# Patient Record
Sex: Male | Born: 1935 | Race: White | Hispanic: No | Marital: Married
Health system: Southern US, Community
[De-identification: ages and names within clinical notes are randomized; demographics above are authoritative.]

---

## 1998-01-15 ENCOUNTER — Other Ambulatory Visit: Admission: RE | Admit: 1998-01-15 | Discharge: 1998-01-15 | Payer: Self-pay | Admitting: Gastroenterology

## 1999-11-03 ENCOUNTER — Inpatient Hospital Stay (HOSPITAL_COMMUNITY): Admission: EM | Admit: 1999-11-03 | Discharge: 1999-11-11 | Payer: Self-pay | Admitting: Pulmonary Disease

## 1999-11-04 ENCOUNTER — Encounter: Payer: Self-pay | Admitting: Pulmonary Disease

## 1999-11-08 ENCOUNTER — Encounter: Payer: Self-pay | Admitting: Pulmonary Disease

## 1999-11-18 ENCOUNTER — Encounter: Admission: RE | Admit: 1999-11-18 | Discharge: 2000-02-16 | Payer: Self-pay | Admitting: Pulmonary Disease

## 2000-01-17 ENCOUNTER — Encounter: Payer: Self-pay | Admitting: Emergency Medicine

## 2000-01-17 ENCOUNTER — Emergency Department (HOSPITAL_COMMUNITY): Admission: EM | Admit: 2000-01-17 | Discharge: 2000-01-17 | Payer: Self-pay | Admitting: *Deleted

## 2000-01-29 ENCOUNTER — Ambulatory Visit: Admission: RE | Admit: 2000-01-29 | Discharge: 2000-01-29 | Payer: Self-pay | Admitting: Pulmonary Disease

## 2000-01-29 ENCOUNTER — Encounter: Payer: Self-pay | Admitting: Pulmonary Disease

## 2000-02-04 ENCOUNTER — Ambulatory Visit: Admission: RE | Admit: 2000-02-04 | Discharge: 2000-02-04 | Payer: Self-pay | Admitting: Pulmonary Disease

## 2000-02-04 ENCOUNTER — Encounter (INDEPENDENT_AMBULATORY_CARE_PROVIDER_SITE_OTHER): Payer: Self-pay

## 2000-07-27 ENCOUNTER — Encounter: Admission: RE | Admit: 2000-07-27 | Discharge: 2000-10-25 | Payer: Self-pay

## 2000-09-09 ENCOUNTER — Ambulatory Visit (HOSPITAL_COMMUNITY): Admission: RE | Admit: 2000-09-09 | Discharge: 2000-09-10 | Payer: Self-pay | Admitting: *Deleted

## 2000-09-17 ENCOUNTER — Encounter: Payer: Self-pay | Admitting: *Deleted

## 2000-09-17 ENCOUNTER — Inpatient Hospital Stay (HOSPITAL_COMMUNITY): Admission: AD | Admit: 2000-09-17 | Discharge: 2000-09-27 | Payer: Self-pay | Admitting: *Deleted

## 2000-09-22 ENCOUNTER — Encounter: Payer: Self-pay | Admitting: *Deleted

## 2000-09-23 ENCOUNTER — Encounter: Payer: Self-pay | Admitting: *Deleted

## 2000-11-23 ENCOUNTER — Encounter: Payer: Self-pay | Admitting: Nephrology

## 2000-11-23 ENCOUNTER — Ambulatory Visit (HOSPITAL_COMMUNITY): Admission: RE | Admit: 2000-11-23 | Discharge: 2000-11-23 | Payer: Self-pay | Admitting: Nephrology

## 2000-12-28 ENCOUNTER — Encounter: Payer: Self-pay | Admitting: *Deleted

## 2000-12-28 ENCOUNTER — Inpatient Hospital Stay (HOSPITAL_COMMUNITY): Admission: RE | Admit: 2000-12-28 | Discharge: 2000-12-31 | Payer: Self-pay | Admitting: Internal Medicine

## 2000-12-31 ENCOUNTER — Encounter: Payer: Self-pay | Admitting: *Deleted

## 2001-01-03 ENCOUNTER — Encounter: Admission: RE | Admit: 2001-01-03 | Discharge: 2001-01-03 | Payer: Self-pay | Admitting: Nephrology

## 2001-01-03 ENCOUNTER — Encounter: Payer: Self-pay | Admitting: Nephrology

## 2001-01-03 ENCOUNTER — Ambulatory Visit (HOSPITAL_COMMUNITY): Admission: RE | Admit: 2001-01-03 | Discharge: 2001-01-03 | Payer: Self-pay | Admitting: Nephrology

## 2001-04-01 ENCOUNTER — Encounter: Payer: Self-pay | Admitting: Pulmonary Disease

## 2001-04-01 ENCOUNTER — Ambulatory Visit (HOSPITAL_COMMUNITY): Admission: RE | Admit: 2001-04-01 | Discharge: 2001-04-01 | Payer: Self-pay | Admitting: Pulmonary Disease

## 2002-08-24 ENCOUNTER — Inpatient Hospital Stay (HOSPITAL_COMMUNITY): Admission: EM | Admit: 2002-08-24 | Discharge: 2002-08-25 | Payer: Self-pay | Admitting: Pulmonary Disease

## 2002-08-24 ENCOUNTER — Encounter: Payer: Self-pay | Admitting: Pulmonary Disease

## 2002-12-27 ENCOUNTER — Inpatient Hospital Stay (HOSPITAL_COMMUNITY): Admission: EM | Admit: 2002-12-27 | Discharge: 2003-01-06 | Payer: Self-pay | Admitting: Emergency Medicine

## 2002-12-27 ENCOUNTER — Encounter: Payer: Self-pay | Admitting: Emergency Medicine

## 2002-12-28 ENCOUNTER — Encounter (INDEPENDENT_AMBULATORY_CARE_PROVIDER_SITE_OTHER): Payer: Self-pay | Admitting: Specialist

## 2002-12-28 ENCOUNTER — Encounter: Payer: Self-pay | Admitting: Pulmonary Disease

## 2002-12-31 ENCOUNTER — Encounter: Payer: Self-pay | Admitting: Pulmonary Disease

## 2003-01-01 ENCOUNTER — Encounter: Payer: Self-pay | Admitting: Cardiothoracic Surgery

## 2003-01-01 ENCOUNTER — Encounter: Payer: Self-pay | Admitting: Cardiology

## 2003-01-05 ENCOUNTER — Encounter (INDEPENDENT_AMBULATORY_CARE_PROVIDER_SITE_OTHER): Payer: Self-pay | Admitting: *Deleted

## 2003-01-05 ENCOUNTER — Encounter: Payer: Self-pay | Admitting: Pulmonary Disease

## 2004-02-21 ENCOUNTER — Ambulatory Visit (HOSPITAL_COMMUNITY): Admission: RE | Admit: 2004-02-21 | Discharge: 2004-02-21 | Payer: Self-pay | Admitting: Gastroenterology

## 2004-02-27 ENCOUNTER — Encounter: Admission: RE | Admit: 2004-02-27 | Discharge: 2004-02-27 | Payer: Self-pay | Admitting: Gastroenterology

## 2004-03-06 ENCOUNTER — Ambulatory Visit (HOSPITAL_COMMUNITY): Admission: RE | Admit: 2004-03-06 | Discharge: 2004-03-06 | Payer: Self-pay | Admitting: Gastroenterology

## 2004-04-24 ENCOUNTER — Ambulatory Visit (HOSPITAL_COMMUNITY): Admission: RE | Admit: 2004-04-24 | Discharge: 2004-04-24 | Payer: Self-pay | Admitting: Gastroenterology

## 2004-05-06 ENCOUNTER — Ambulatory Visit: Payer: Self-pay | Admitting: Pulmonary Disease

## 2004-05-08 ENCOUNTER — Ambulatory Visit: Payer: Self-pay | Admitting: Pulmonary Disease

## 2004-05-08 ENCOUNTER — Inpatient Hospital Stay (HOSPITAL_COMMUNITY): Admission: EM | Admit: 2004-05-08 | Discharge: 2004-06-04 | Payer: Self-pay | Admitting: Pulmonary Disease

## 2004-05-09 ENCOUNTER — Encounter: Payer: Self-pay | Admitting: Cardiology

## 2004-05-16 ENCOUNTER — Ambulatory Visit: Payer: Self-pay | Admitting: Cardiology

## 2004-05-23 ENCOUNTER — Ambulatory Visit: Payer: Self-pay | Admitting: Internal Medicine

## 2004-06-04 ENCOUNTER — Inpatient Hospital Stay: Admission: RE | Admit: 2004-06-04 | Discharge: 2004-06-09 | Payer: Self-pay | Admitting: Pulmonary Disease

## 2004-06-04 ENCOUNTER — Ambulatory Visit: Payer: Self-pay | Admitting: Pulmonary Disease

## 2004-06-12 ENCOUNTER — Ambulatory Visit: Payer: Self-pay | Admitting: Pulmonary Disease

## 2004-06-25 ENCOUNTER — Ambulatory Visit: Payer: Self-pay | Admitting: Pulmonary Disease

## 2004-07-09 ENCOUNTER — Ambulatory Visit: Payer: Self-pay

## 2004-07-09 ENCOUNTER — Ambulatory Visit: Payer: Self-pay | Admitting: Internal Medicine

## 2004-07-10 ENCOUNTER — Ambulatory Visit: Payer: Self-pay | Admitting: Pulmonary Disease

## 2004-07-31 ENCOUNTER — Ambulatory Visit: Payer: Self-pay | Admitting: Cardiology

## 2004-08-11 ENCOUNTER — Ambulatory Visit: Payer: Self-pay | Admitting: Cardiology

## 2004-08-12 ENCOUNTER — Ambulatory Visit: Payer: Self-pay | Admitting: Pulmonary Disease

## 2004-08-27 ENCOUNTER — Ambulatory Visit: Payer: Self-pay | Admitting: Pulmonary Disease

## 2004-09-01 ENCOUNTER — Ambulatory Visit: Payer: Self-pay | Admitting: Internal Medicine

## 2004-09-04 ENCOUNTER — Ambulatory Visit: Payer: Self-pay | Admitting: Cardiology

## 2004-09-10 ENCOUNTER — Ambulatory Visit: Payer: Self-pay | Admitting: Internal Medicine

## 2004-09-10 ENCOUNTER — Ambulatory Visit: Payer: Self-pay | Admitting: Pulmonary Disease

## 2004-09-25 ENCOUNTER — Ambulatory Visit: Payer: Self-pay | Admitting: Cardiology

## 2004-09-25 ENCOUNTER — Ambulatory Visit: Payer: Self-pay | Admitting: Internal Medicine

## 2004-09-29 ENCOUNTER — Ambulatory Visit: Payer: Self-pay | Admitting: Cardiology

## 2004-10-08 ENCOUNTER — Ambulatory Visit: Payer: Self-pay | Admitting: Pulmonary Disease

## 2004-10-27 ENCOUNTER — Ambulatory Visit: Payer: Self-pay | Admitting: Cardiology

## 2004-11-05 ENCOUNTER — Ambulatory Visit: Payer: Self-pay

## 2004-11-10 ENCOUNTER — Ambulatory Visit: Payer: Self-pay | Admitting: *Deleted

## 2004-11-19 ENCOUNTER — Ambulatory Visit: Payer: Self-pay | Admitting: Pulmonary Disease

## 2004-11-24 ENCOUNTER — Ambulatory Visit: Payer: Self-pay | Admitting: Cardiology

## 2004-11-27 ENCOUNTER — Ambulatory Visit: Payer: Self-pay

## 2004-11-27 ENCOUNTER — Ambulatory Visit: Payer: Self-pay | Admitting: Internal Medicine

## 2004-12-03 ENCOUNTER — Ambulatory Visit: Payer: Self-pay | Admitting: Cardiology

## 2004-12-08 ENCOUNTER — Ambulatory Visit: Payer: Self-pay | Admitting: Internal Medicine

## 2004-12-12 ENCOUNTER — Ambulatory Visit: Payer: Self-pay | Admitting: Internal Medicine

## 2004-12-12 ENCOUNTER — Ambulatory Visit: Payer: Self-pay | Admitting: Cardiology

## 2004-12-22 ENCOUNTER — Ambulatory Visit: Payer: Self-pay | Admitting: Cardiology

## 2005-01-05 ENCOUNTER — Ambulatory Visit: Payer: Self-pay | Admitting: Cardiology

## 2005-01-16 ENCOUNTER — Ambulatory Visit: Payer: Self-pay | Admitting: Cardiology

## 2005-01-19 ENCOUNTER — Ambulatory Visit: Payer: Self-pay | Admitting: Internal Medicine

## 2005-01-26 ENCOUNTER — Ambulatory Visit: Payer: Self-pay | Admitting: Cardiology

## 2005-02-02 ENCOUNTER — Ambulatory Visit: Payer: Self-pay | Admitting: Cardiology

## 2005-02-03 ENCOUNTER — Ambulatory Visit: Payer: Self-pay | Admitting: Internal Medicine

## 2005-02-03 ENCOUNTER — Ambulatory Visit (HOSPITAL_COMMUNITY): Admission: RE | Admit: 2005-02-03 | Discharge: 2005-02-03 | Payer: Self-pay | Admitting: Internal Medicine

## 2005-02-18 ENCOUNTER — Ambulatory Visit: Payer: Self-pay | Admitting: Cardiology

## 2005-02-24 ENCOUNTER — Ambulatory Visit: Payer: Self-pay | Admitting: Pulmonary Disease

## 2005-02-25 ENCOUNTER — Ambulatory Visit: Payer: Self-pay | Admitting: Cardiology

## 2005-03-05 ENCOUNTER — Ambulatory Visit: Payer: Self-pay | Admitting: Internal Medicine

## 2005-03-11 ENCOUNTER — Ambulatory Visit: Payer: Self-pay | Admitting: Cardiology

## 2005-03-17 ENCOUNTER — Ambulatory Visit: Payer: Self-pay | Admitting: Cardiology

## 2005-03-23 ENCOUNTER — Ambulatory Visit: Payer: Self-pay | Admitting: Cardiology

## 2005-04-07 ENCOUNTER — Ambulatory Visit: Payer: Self-pay | Admitting: Pulmonary Disease

## 2005-04-20 ENCOUNTER — Ambulatory Visit: Payer: Self-pay | Admitting: Internal Medicine

## 2005-04-22 ENCOUNTER — Ambulatory Visit: Payer: Self-pay | Admitting: Pulmonary Disease

## 2005-04-24 ENCOUNTER — Ambulatory Visit: Payer: Self-pay | Admitting: Cardiovascular Disease

## 2005-04-29 ENCOUNTER — Ambulatory Visit: Payer: Self-pay | Admitting: Cardiology

## 2005-05-06 ENCOUNTER — Ambulatory Visit: Payer: Self-pay | Admitting: *Deleted

## 2005-05-12 ENCOUNTER — Ambulatory Visit: Payer: Self-pay | Admitting: Internal Medicine

## 2005-05-22 ENCOUNTER — Ambulatory Visit: Payer: Self-pay | Admitting: Cardiology

## 2005-06-02 ENCOUNTER — Ambulatory Visit: Payer: Self-pay | Admitting: Pulmonary Disease

## 2005-06-12 ENCOUNTER — Ambulatory Visit: Payer: Self-pay | Admitting: Cardiology

## 2005-07-10 ENCOUNTER — Ambulatory Visit: Payer: Self-pay | Admitting: Cardiology

## 2005-07-20 ENCOUNTER — Ambulatory Visit: Payer: Self-pay | Admitting: Cardiology

## 2005-07-27 ENCOUNTER — Ambulatory Visit: Payer: Self-pay | Admitting: Internal Medicine

## 2005-08-03 ENCOUNTER — Ambulatory Visit: Payer: Self-pay | Admitting: Pulmonary Disease

## 2005-08-04 ENCOUNTER — Ambulatory Visit: Payer: Self-pay | Admitting: Cardiology

## 2005-08-13 ENCOUNTER — Ambulatory Visit: Payer: Self-pay | Admitting: Pulmonary Disease

## 2005-08-17 ENCOUNTER — Ambulatory Visit: Payer: Self-pay | Admitting: Internal Medicine

## 2005-08-24 ENCOUNTER — Ambulatory Visit: Payer: Self-pay | Admitting: Cardiology

## 2005-09-07 ENCOUNTER — Ambulatory Visit: Payer: Self-pay | Admitting: Cardiology

## 2005-09-10 ENCOUNTER — Encounter: Admission: RE | Admit: 2005-09-10 | Discharge: 2005-09-10 | Payer: Self-pay | Admitting: Nephrology

## 2005-09-21 ENCOUNTER — Ambulatory Visit: Payer: Self-pay | Admitting: Pulmonary Disease

## 2005-09-25 ENCOUNTER — Ambulatory Visit: Payer: Self-pay | Admitting: Cardiovascular Disease

## 2005-10-05 ENCOUNTER — Ambulatory Visit: Payer: Self-pay | Admitting: Cardiology

## 2005-10-20 ENCOUNTER — Ambulatory Visit: Payer: Self-pay | Admitting: Cardiology

## 2005-10-23 ENCOUNTER — Ambulatory Visit: Payer: Self-pay | Admitting: Cardiology

## 2005-10-23 ENCOUNTER — Ambulatory Visit (HOSPITAL_COMMUNITY): Admission: RE | Admit: 2005-10-23 | Discharge: 2005-10-23 | Payer: Self-pay | Admitting: Cardiology

## 2005-10-28 ENCOUNTER — Ambulatory Visit: Payer: Self-pay | Admitting: *Deleted

## 2005-11-04 ENCOUNTER — Encounter: Payer: Self-pay | Admitting: Pulmonary Disease

## 2005-11-05 ENCOUNTER — Ambulatory Visit: Payer: Self-pay | Admitting: Cardiology

## 2005-11-05 ENCOUNTER — Inpatient Hospital Stay (HOSPITAL_COMMUNITY): Admission: AD | Admit: 2005-11-05 | Discharge: 2005-11-23 | Payer: Self-pay | Admitting: Internal Medicine

## 2005-11-05 ENCOUNTER — Ambulatory Visit: Payer: Self-pay | Admitting: Internal Medicine

## 2005-11-25 ENCOUNTER — Ambulatory Visit: Payer: Self-pay | Admitting: Pulmonary Disease

## 2005-11-27 ENCOUNTER — Ambulatory Visit: Payer: Self-pay | Admitting: Internal Medicine

## 2005-11-27 ENCOUNTER — Ambulatory Visit: Payer: Self-pay | Admitting: Cardiology

## 2005-12-11 ENCOUNTER — Ambulatory Visit: Payer: Self-pay | Admitting: Cardiology

## 2005-12-11 ENCOUNTER — Ambulatory Visit: Payer: Self-pay | Admitting: Internal Medicine

## 2005-12-17 ENCOUNTER — Inpatient Hospital Stay (HOSPITAL_COMMUNITY): Admission: AD | Admit: 2005-12-17 | Discharge: 2005-12-25 | Payer: Self-pay | Admitting: Pulmonary Disease

## 2005-12-17 ENCOUNTER — Ambulatory Visit: Payer: Self-pay | Admitting: Cardiovascular Disease

## 2005-12-17 ENCOUNTER — Ambulatory Visit: Payer: Self-pay | Admitting: Pulmonary Disease

## 2005-12-31 ENCOUNTER — Ambulatory Visit: Payer: Self-pay | Admitting: Internal Medicine

## 2006-01-08 ENCOUNTER — Ambulatory Visit: Payer: Self-pay | Admitting: Internal Medicine

## 2006-01-11 ENCOUNTER — Ambulatory Visit: Payer: Self-pay | Admitting: Pulmonary Disease

## 2006-01-22 ENCOUNTER — Ambulatory Visit: Payer: Self-pay | Admitting: Cardiology

## 2006-02-05 ENCOUNTER — Ambulatory Visit: Payer: Self-pay | Admitting: Internal Medicine

## 2006-02-22 ENCOUNTER — Ambulatory Visit: Payer: Self-pay | Admitting: Pulmonary Disease

## 2006-03-05 ENCOUNTER — Ambulatory Visit: Payer: Self-pay | Admitting: Cardiology

## 2006-03-09 ENCOUNTER — Ambulatory Visit: Payer: Self-pay | Admitting: Internal Medicine

## 2006-03-19 ENCOUNTER — Ambulatory Visit: Payer: Self-pay | Admitting: Cardiology

## 2006-04-02 ENCOUNTER — Ambulatory Visit: Payer: Self-pay | Admitting: Cardiology

## 2006-04-06 ENCOUNTER — Ambulatory Visit: Payer: Self-pay | Admitting: Pulmonary Disease

## 2006-04-12 ENCOUNTER — Ambulatory Visit: Payer: Self-pay | Admitting: Cardiovascular Disease

## 2006-04-15 ENCOUNTER — Ambulatory Visit: Payer: Self-pay | Admitting: Internal Medicine

## 2006-04-15 ENCOUNTER — Emergency Department (HOSPITAL_COMMUNITY): Admission: EM | Admit: 2006-04-15 | Discharge: 2006-04-15 | Payer: Self-pay | Admitting: Emergency Medicine

## 2006-04-22 ENCOUNTER — Ambulatory Visit: Payer: Self-pay | Admitting: Pulmonary Disease

## 2006-05-03 ENCOUNTER — Ambulatory Visit: Payer: Self-pay | Admitting: Cardiology

## 2006-05-11 ENCOUNTER — Ambulatory Visit: Payer: Self-pay | Admitting: Pulmonary Disease

## 2006-05-26 ENCOUNTER — Ambulatory Visit: Payer: Self-pay | Admitting: Cardiology

## 2006-06-22 ENCOUNTER — Ambulatory Visit: Payer: Self-pay | Admitting: Pulmonary Disease

## 2006-07-08 ENCOUNTER — Ambulatory Visit: Payer: Self-pay | Admitting: Internal Medicine

## 2006-07-16 ENCOUNTER — Ambulatory Visit: Payer: Self-pay | Admitting: Internal Medicine

## 2006-07-20 ENCOUNTER — Ambulatory Visit: Payer: Self-pay | Admitting: Internal Medicine

## 2006-07-28 ENCOUNTER — Ambulatory Visit: Payer: Self-pay | Admitting: Internal Medicine

## 2006-08-03 ENCOUNTER — Ambulatory Visit: Payer: Self-pay | Admitting: Pulmonary Disease

## 2006-08-05 ENCOUNTER — Ambulatory Visit: Payer: Self-pay | Admitting: Internal Medicine

## 2006-08-05 LAB — CONVERTED CEMR LAB
BUN: 64 mg/dL — ABNORMAL HIGH (ref 6–23)
Calcium: 8.8 mg/dL (ref 8.4–10.5)
GFR calc Af Amer: 28 mL/min
GFR calc non Af Amer: 23 mL/min
INR: 1.8 (ref 0.9–2.0)
Potassium: 4.7 meq/L (ref 3.5–5.1)

## 2006-08-09 ENCOUNTER — Ambulatory Visit: Payer: Self-pay | Admitting: Cardiology

## 2006-08-09 LAB — CONVERTED CEMR LAB
BUN: 55 mg/dL — ABNORMAL HIGH (ref 6–23)
CO2: 27 meq/L (ref 19–32)
Calcium: 9.2 mg/dL (ref 8.4–10.5)
GFR calc Af Amer: 29 mL/min
GFR calc non Af Amer: 24 mL/min
Glucose, Bld: 282 mg/dL — ABNORMAL HIGH (ref 70–99)
Potassium: 3.9 meq/L (ref 3.5–5.1)

## 2006-08-31 ENCOUNTER — Ambulatory Visit: Payer: Self-pay | Admitting: Cardiology

## 2006-09-09 IMAGING — CR DG CHEST 1V PORT
1 series · 1 of 1 positions shown · non-contrast
Comparison: 12/17/05.

CLINICAL DATA: Evaluate PICC line placement.
 CHEST ? 1 VIEW:

[view not recorded]
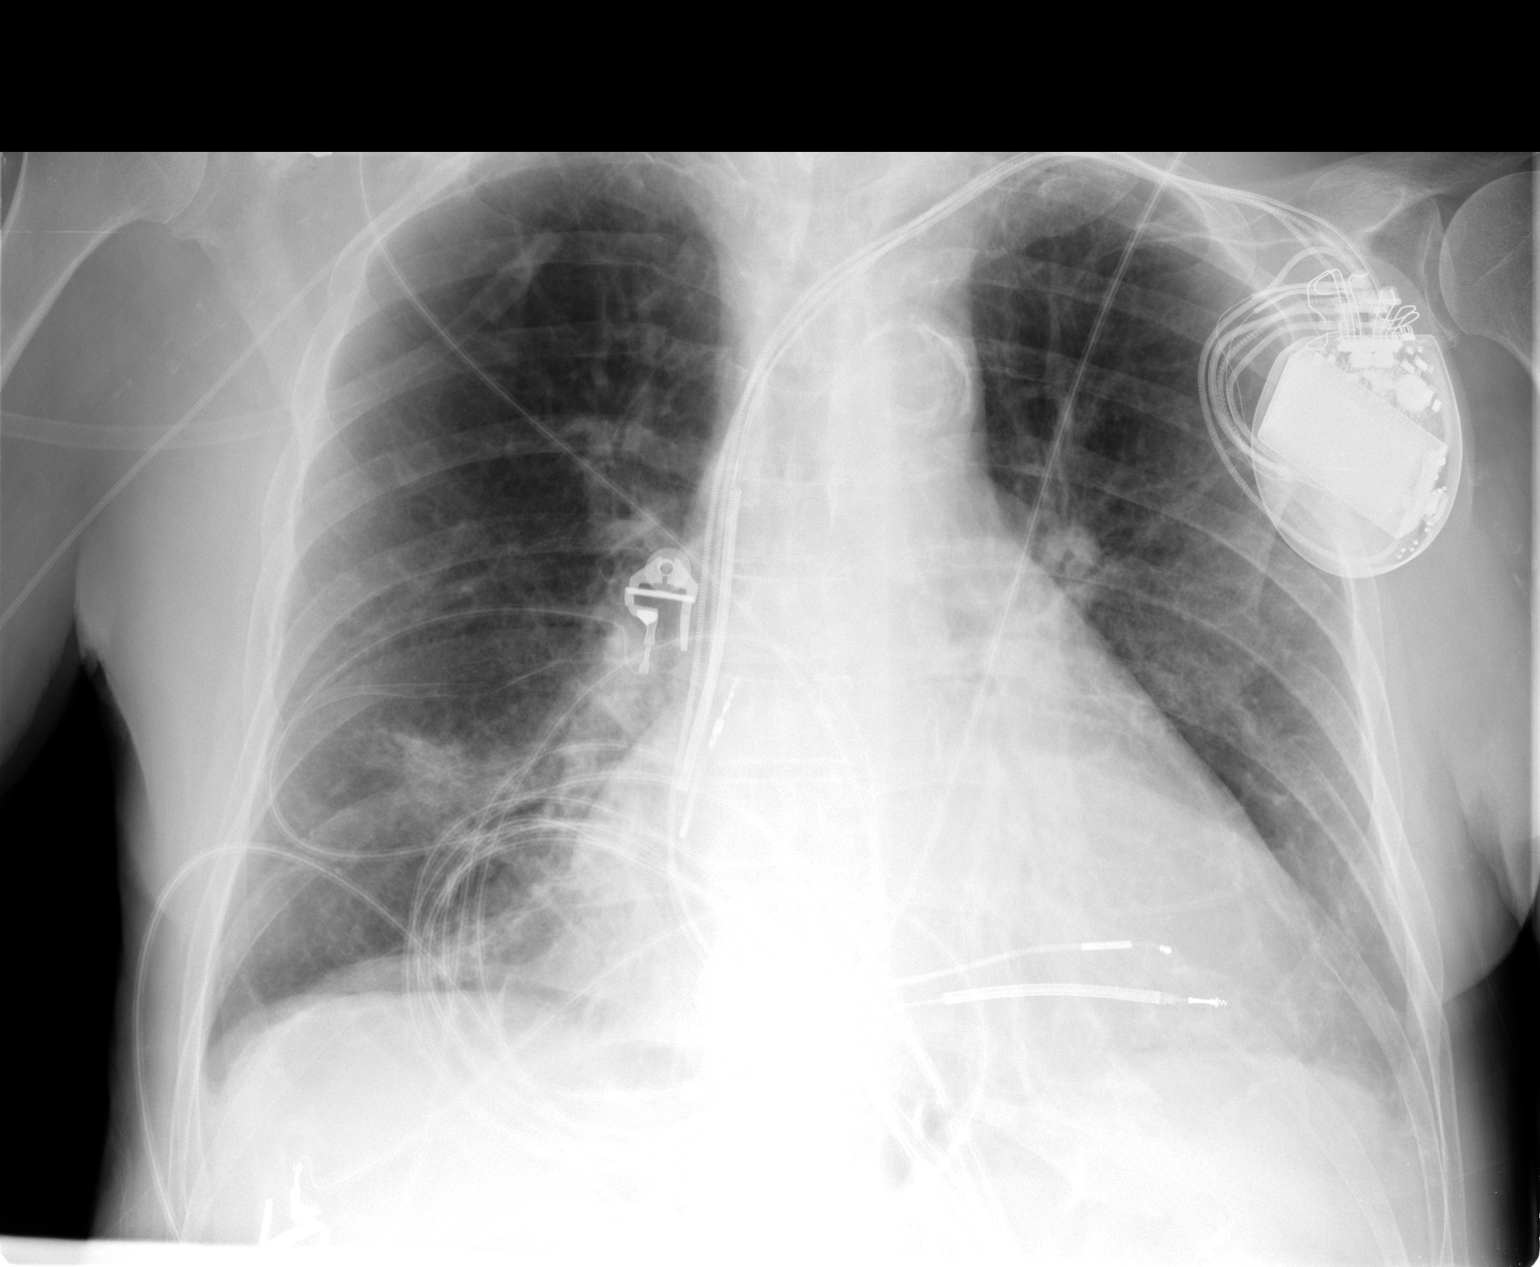

[1 of 1 positions shown; findings below may reference images not displayed]

FINDINGS: Right-sided PICC line tip is within the right IJ.  This needs repositioning into the SVC.  Heart size is enlarged.
 Again seen is right lower lobe airspace disease consistent with atelectasis or pneumonia.  Recommend follow-up examination to ensure resolution.
IMPRESSION: 1.  No change in aeration of the lungs.
 2.  Right-sided PICC line remains within the right IJ.

## 2006-09-13 ENCOUNTER — Ambulatory Visit: Payer: Self-pay | Admitting: Pulmonary Disease

## 2006-09-14 ENCOUNTER — Ambulatory Visit: Payer: Self-pay | Admitting: Cardiology

## 2006-09-16 ENCOUNTER — Ambulatory Visit: Payer: Self-pay | Admitting: Internal Medicine

## 2006-09-29 ENCOUNTER — Ambulatory Visit: Payer: Self-pay | Admitting: Pulmonary Disease

## 2006-09-29 LAB — CONVERTED CEMR LAB
ALT: 10 units/L (ref 0–40)
AST: 26 units/L (ref 0–37)
Albumin: 3.4 g/dL — ABNORMAL LOW (ref 3.5–5.2)
Alkaline Phosphatase: 95 units/L (ref 39–117)
BUN: 40 mg/dL — ABNORMAL HIGH (ref 6–23)
Basophils Absolute: 0 10*3/uL (ref 0.0–0.1)
Calcium: 9 mg/dL (ref 8.4–10.5)
Chloride: 108 meq/L (ref 96–112)
Creatinine, Ser: 2.4 mg/dL — ABNORMAL HIGH (ref 0.4–1.5)
Eosinophils Absolute: 0.1 10*3/uL (ref 0.0–0.6)
GFR calc non Af Amer: 28 mL/min
HCT: 35.3 % — ABNORMAL LOW (ref 39.0–52.0)
Hgb A1c MFr Bld: 7.3 % — ABNORMAL HIGH (ref 4.6–6.0)
MCHC: 33.8 g/dL (ref 30.0–36.0)
MCV: 90.5 fL (ref 78.0–100.0)
Neutrophils Relative %: 90.1 % — ABNORMAL HIGH (ref 43.0–77.0)
Platelets: 300 10*3/uL (ref 150–400)
RBC: 3.9 M/uL — ABNORMAL LOW (ref 4.22–5.81)
RDW: 15.7 % — ABNORMAL HIGH (ref 11.5–14.6)
Total Bilirubin: 1 mg/dL (ref 0.3–1.2)

## 2006-10-12 ENCOUNTER — Ambulatory Visit: Payer: Self-pay | Admitting: Cardiology

## 2006-10-14 ENCOUNTER — Ambulatory Visit: Payer: Self-pay | Admitting: Internal Medicine

## 2006-10-28 ENCOUNTER — Ambulatory Visit: Payer: Self-pay | Admitting: Cardiology

## 2006-11-26 ENCOUNTER — Ambulatory Visit: Payer: Self-pay | Admitting: Internal Medicine

## 2006-11-30 ENCOUNTER — Ambulatory Visit: Payer: Self-pay | Admitting: Cardiology

## 2006-12-20 ENCOUNTER — Ambulatory Visit: Payer: Self-pay | Admitting: Pulmonary Disease

## 2007-01-04 ENCOUNTER — Ambulatory Visit: Payer: Self-pay | Admitting: Cardiology

## 2007-01-20 ENCOUNTER — Inpatient Hospital Stay (HOSPITAL_COMMUNITY): Admission: AD | Admit: 2007-01-20 | Discharge: 2007-01-28 | Payer: Self-pay | Admitting: Pulmonary Disease

## 2007-01-20 ENCOUNTER — Ambulatory Visit: Payer: Self-pay | Admitting: Pulmonary Disease

## 2007-01-20 ENCOUNTER — Ambulatory Visit: Payer: Self-pay | Admitting: Internal Medicine

## 2007-02-02 ENCOUNTER — Ambulatory Visit: Payer: Self-pay | Admitting: Internal Medicine

## 2007-02-02 LAB — CONVERTED CEMR LAB
BUN: 72 mg/dL — ABNORMAL HIGH (ref 6–23)
Calcium: 9.3 mg/dL (ref 8.4–10.5)
Chloride: 103 meq/L (ref 96–112)
Creatinine, Ser: 3.8 mg/dL — ABNORMAL HIGH (ref 0.4–1.5)
GFR calc non Af Amer: 16 mL/min
INR: 1.7 (ref 0.9–2.0)
Pro B Natriuretic peptide (BNP): 676 pg/mL — ABNORMAL HIGH (ref 0.0–100.0)
Prothrombin Time: 15.9 s — ABNORMAL HIGH (ref 10.0–14.0)
Sodium: 141 meq/L (ref 135–145)

## 2007-02-14 ENCOUNTER — Ambulatory Visit: Payer: Self-pay | Admitting: Cardiology

## 2007-02-21 ENCOUNTER — Ambulatory Visit: Payer: Self-pay | Admitting: Cardiology

## 2007-02-24 ENCOUNTER — Ambulatory Visit: Payer: Self-pay | Admitting: Internal Medicine

## 2007-02-24 ENCOUNTER — Emergency Department (HOSPITAL_COMMUNITY): Admission: EM | Admit: 2007-02-24 | Discharge: 2007-02-25 | Payer: Self-pay | Admitting: Emergency Medicine

## 2007-02-27 ENCOUNTER — Emergency Department (HOSPITAL_COMMUNITY): Admission: EM | Admit: 2007-02-27 | Discharge: 2007-02-27 | Payer: Self-pay | Admitting: Emergency Medicine

## 2007-03-01 ENCOUNTER — Ambulatory Visit: Payer: Self-pay | Admitting: Internal Medicine

## 2007-03-01 ENCOUNTER — Ambulatory Visit (HOSPITAL_COMMUNITY): Admission: RE | Admit: 2007-03-01 | Discharge: 2007-03-01 | Payer: Self-pay | Admitting: Internal Medicine

## 2007-03-17 ENCOUNTER — Ambulatory Visit: Payer: Self-pay | Admitting: Internal Medicine

## 2007-03-22 ENCOUNTER — Ambulatory Visit: Payer: Self-pay | Admitting: Internal Medicine

## 2007-03-29 ENCOUNTER — Ambulatory Visit: Payer: Self-pay | Admitting: Pulmonary Disease

## 2007-03-31 ENCOUNTER — Ambulatory Visit (HOSPITAL_COMMUNITY): Admission: RE | Admit: 2007-03-31 | Discharge: 2007-03-31 | Payer: Self-pay | Admitting: Internal Medicine

## 2007-04-12 ENCOUNTER — Ambulatory Visit: Payer: Self-pay | Admitting: Pulmonary Disease

## 2007-04-14 ENCOUNTER — Ambulatory Visit: Payer: Self-pay | Admitting: Cardiology

## 2007-05-10 ENCOUNTER — Ambulatory Visit: Payer: Self-pay | Admitting: Pulmonary Disease

## 2007-05-20 ENCOUNTER — Ambulatory Visit: Payer: Self-pay | Admitting: Cardiology

## 2007-05-23 ENCOUNTER — Ambulatory Visit: Payer: Self-pay | Admitting: Pulmonary Disease

## 2007-05-24 DIAGNOSIS — J449 Chronic obstructive pulmonary disease, unspecified: Secondary | ICD-10-CM

## 2007-05-24 DIAGNOSIS — J4489 Other specified chronic obstructive pulmonary disease: Secondary | ICD-10-CM | POA: Insufficient documentation

## 2007-05-24 DIAGNOSIS — I509 Heart failure, unspecified: Secondary | ICD-10-CM | POA: Insufficient documentation

## 2007-05-24 DIAGNOSIS — I2510 Atherosclerotic heart disease of native coronary artery without angina pectoris: Secondary | ICD-10-CM | POA: Insufficient documentation

## 2007-05-24 DIAGNOSIS — R21 Rash and other nonspecific skin eruption: Secondary | ICD-10-CM

## 2007-05-24 DIAGNOSIS — N189 Chronic kidney disease, unspecified: Secondary | ICD-10-CM

## 2007-05-24 DIAGNOSIS — I4891 Unspecified atrial fibrillation: Secondary | ICD-10-CM

## 2007-05-24 DIAGNOSIS — E119 Type 2 diabetes mellitus without complications: Secondary | ICD-10-CM

## 2007-05-30 ENCOUNTER — Ambulatory Visit: Payer: Self-pay | Admitting: Internal Medicine

## 2007-05-30 ENCOUNTER — Inpatient Hospital Stay (HOSPITAL_COMMUNITY): Admission: EM | Admit: 2007-05-30 | Discharge: 2007-06-04 | Payer: Self-pay | Admitting: Emergency Medicine

## 2007-06-10 ENCOUNTER — Ambulatory Visit: Payer: Self-pay | Admitting: Internal Medicine

## 2007-06-10 ENCOUNTER — Ambulatory Visit: Payer: Self-pay | Admitting: Cardiology

## 2007-06-13 ENCOUNTER — Ambulatory Visit: Payer: Self-pay | Admitting: Pulmonary Disease

## 2007-06-13 LAB — CONVERTED CEMR LAB: INR: 1.7 — ABNORMAL HIGH (ref 0.8–1.0)

## 2007-06-17 ENCOUNTER — Telehealth: Payer: Self-pay | Admitting: Pulmonary Disease

## 2007-06-20 ENCOUNTER — Telehealth: Payer: Self-pay | Admitting: Pulmonary Disease

## 2007-06-22 IMAGING — CR DG CHEST 2V
3 series · 3 of 3 positions shown · non-contrast
Comparison: 12/20/05.

CLINICAL DATA: Cough for 10 days.  
 CHEST - 2 VIEW:

[view not recorded (1 of 3)]
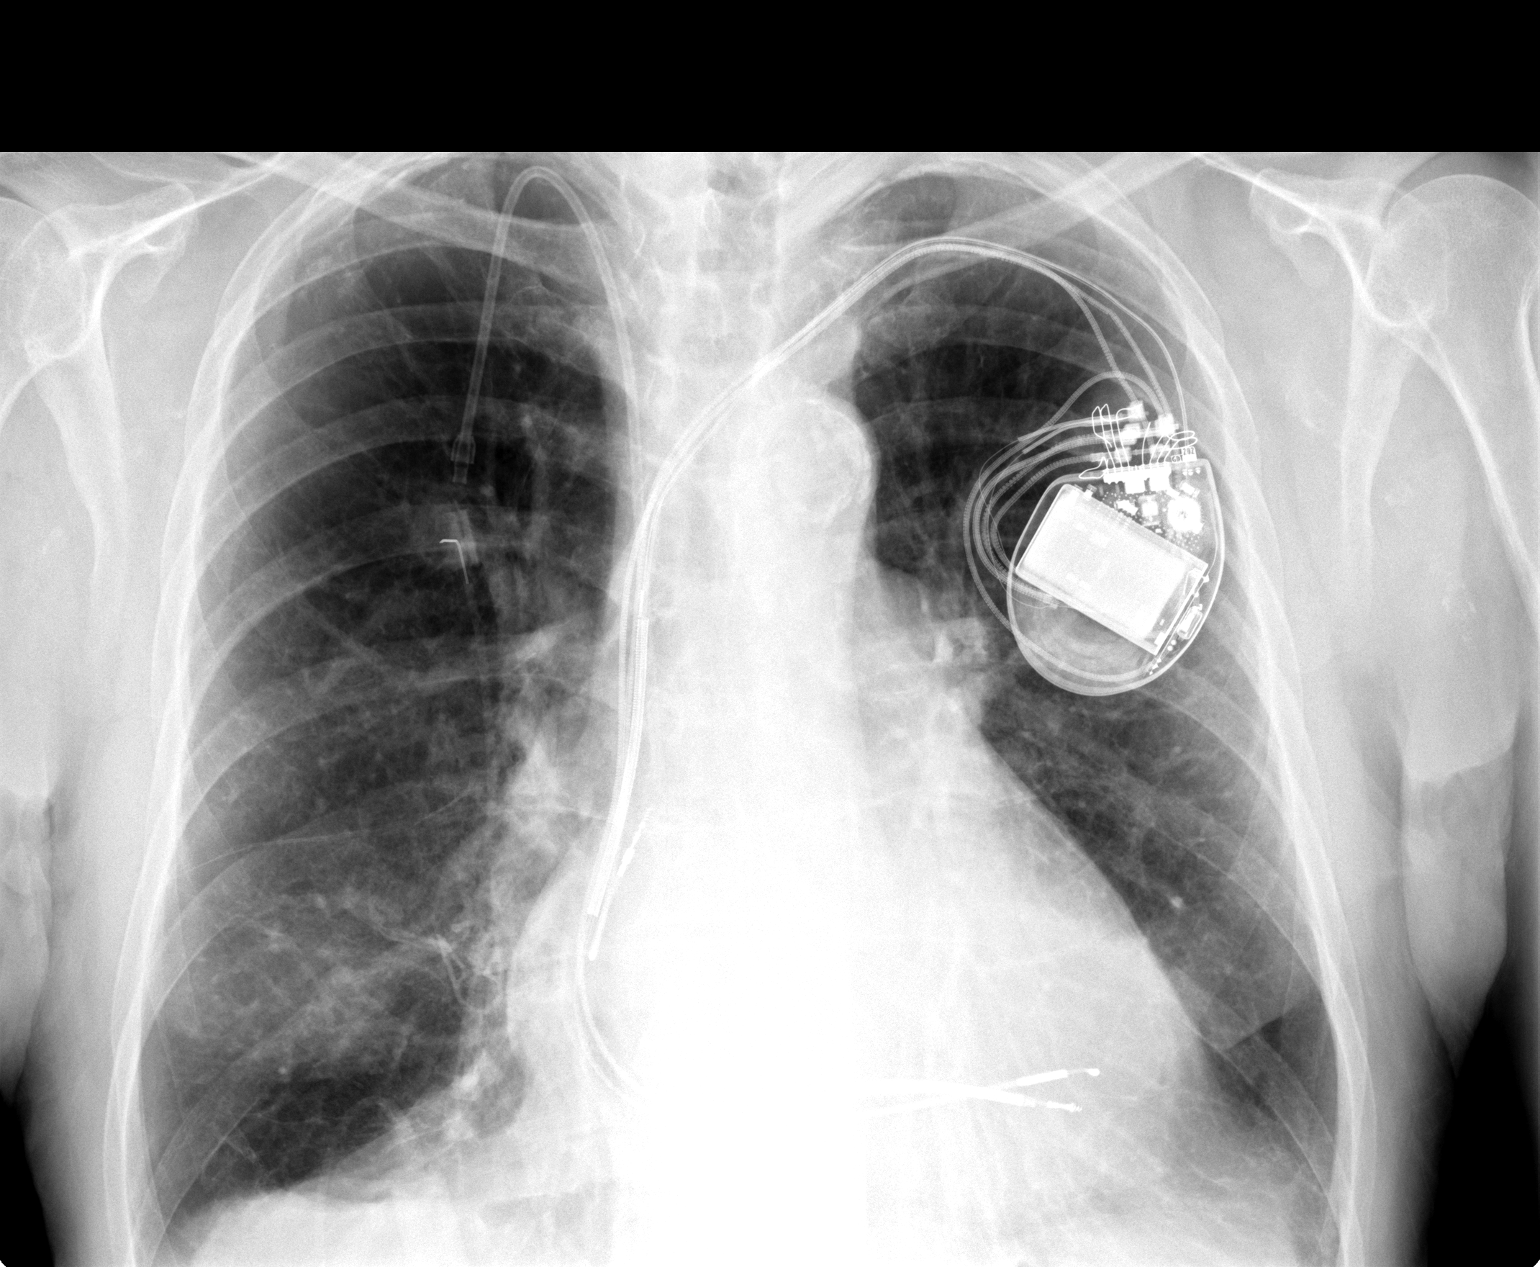

[view not recorded (2 of 3)]
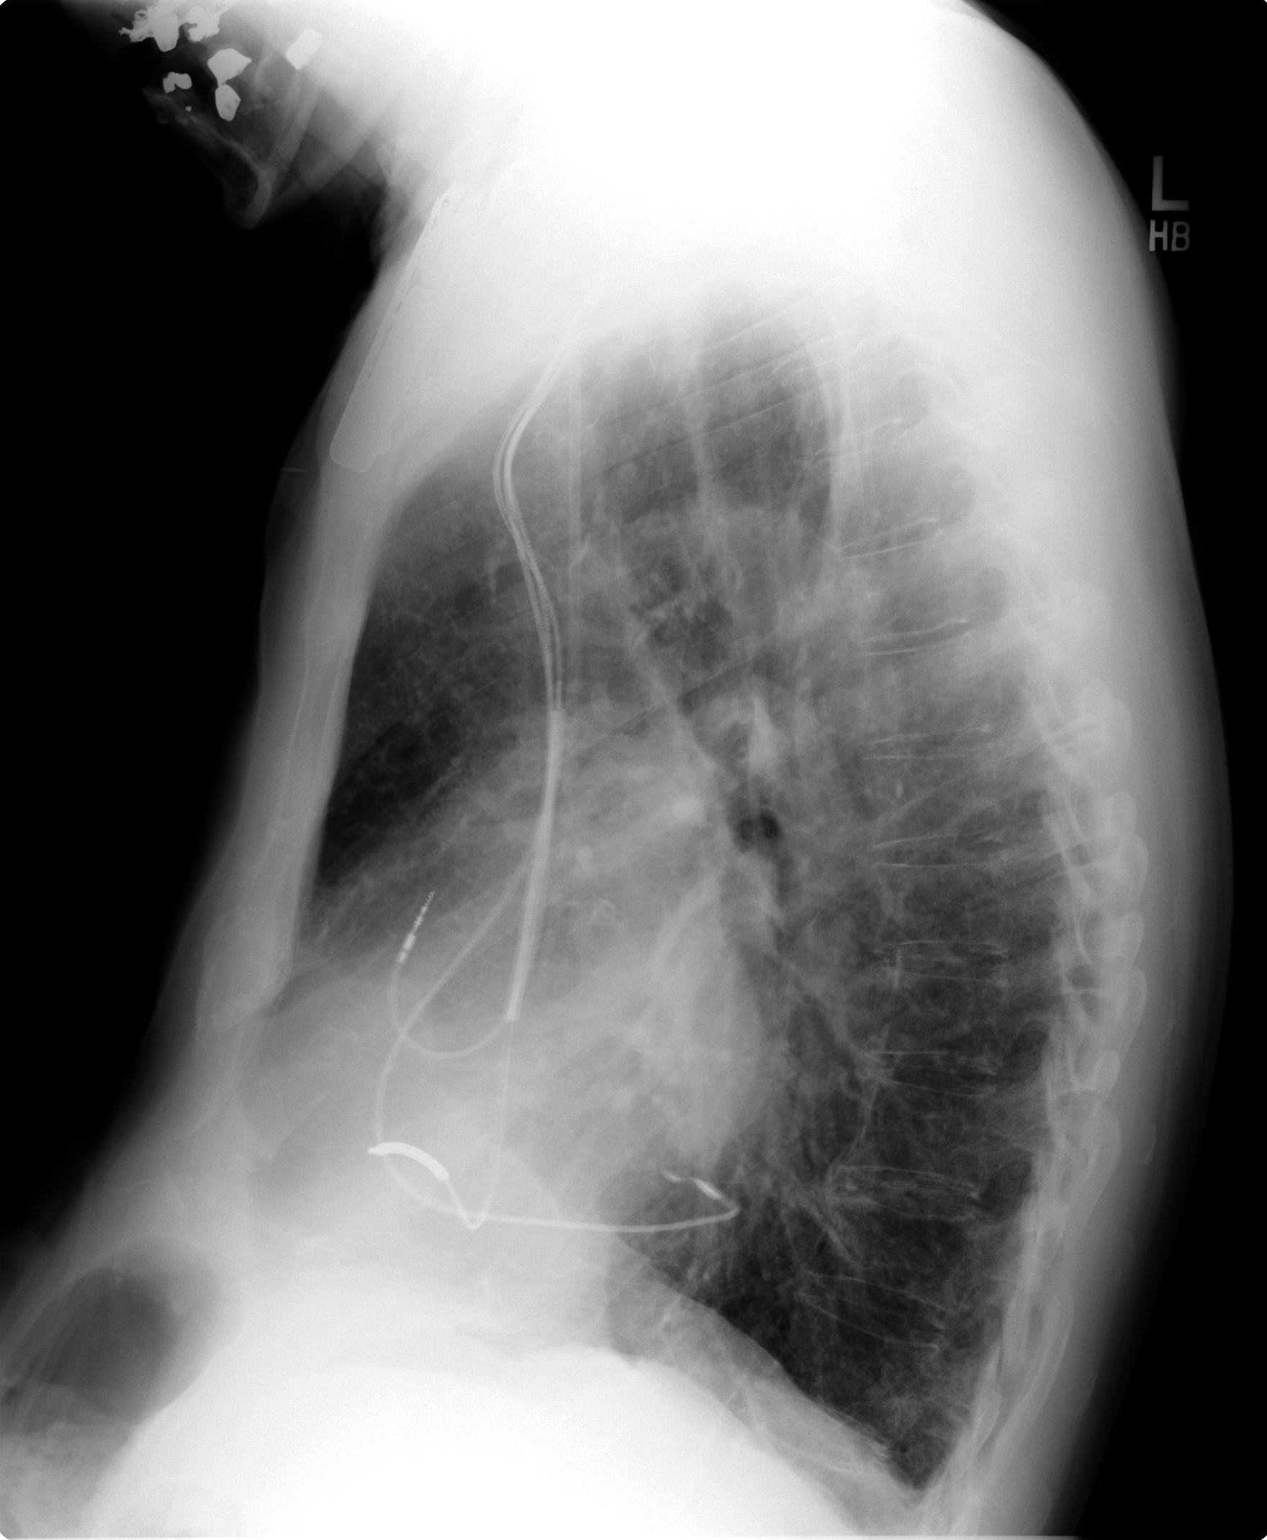

[view not recorded (3 of 3)]
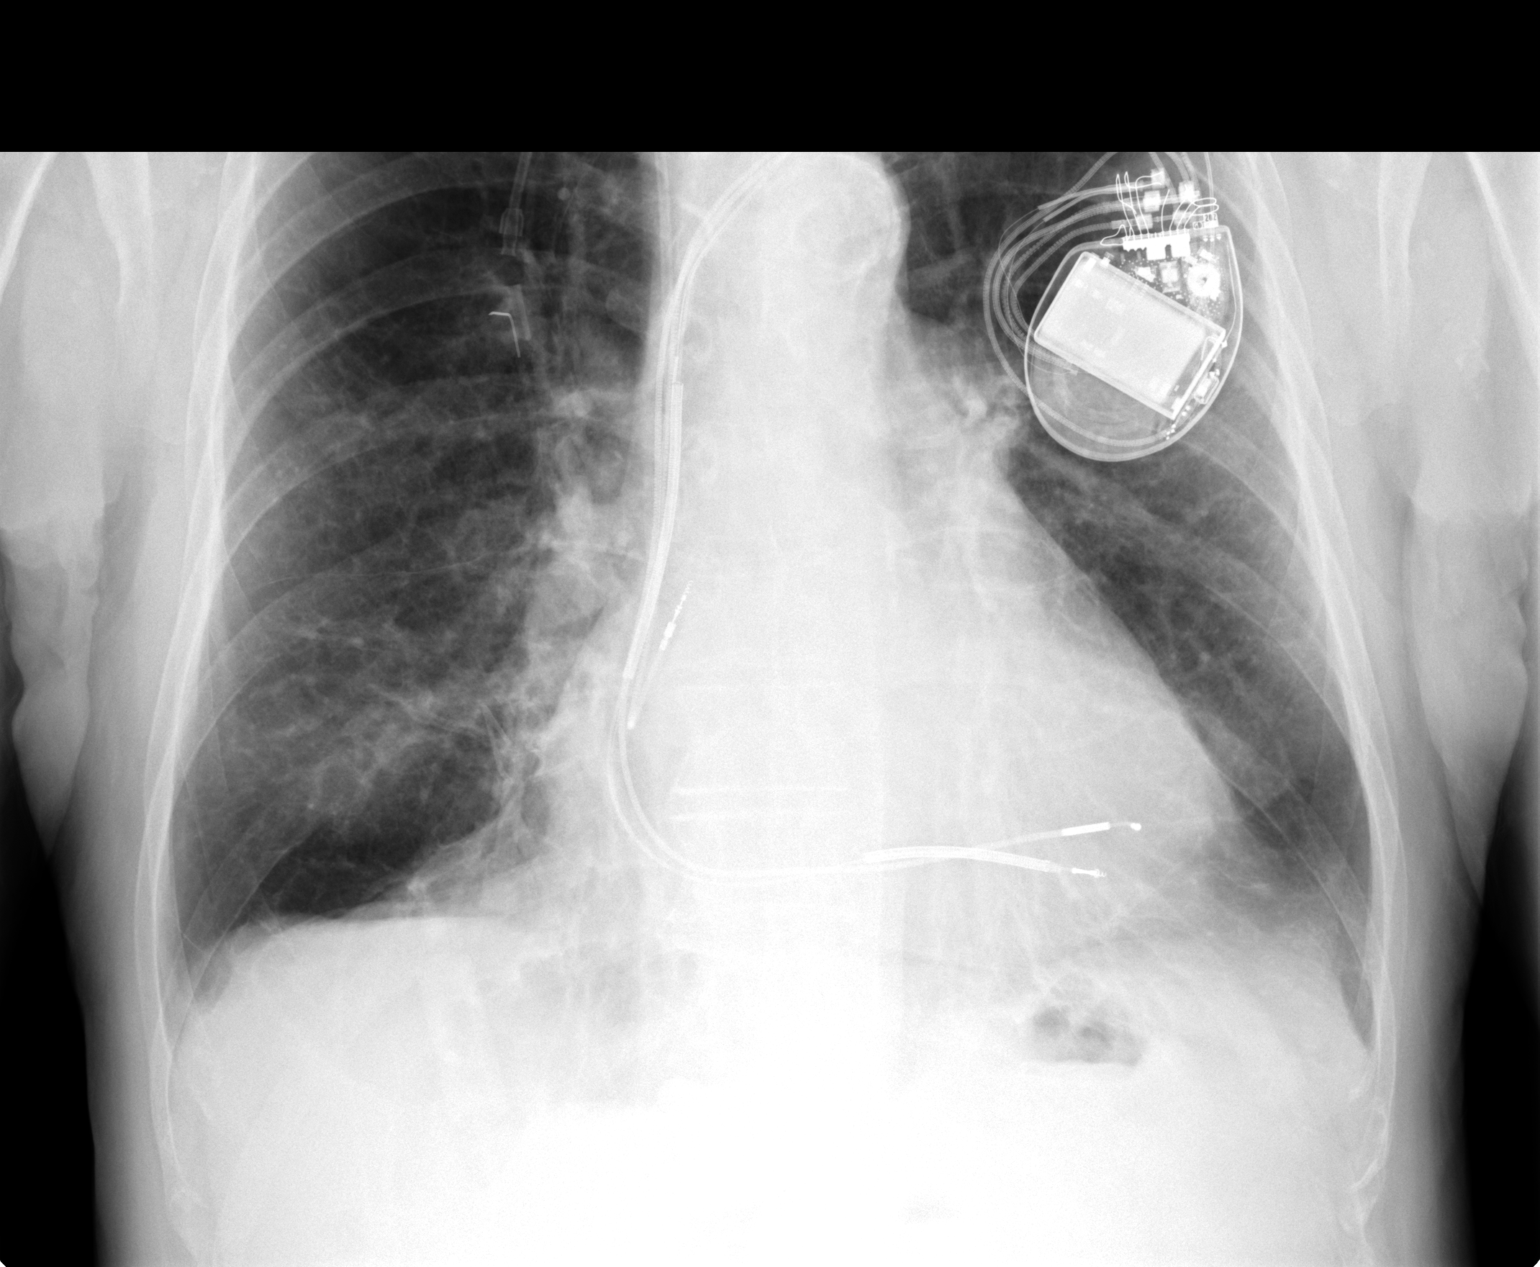

[3 of 3 positions shown; findings below may reference images not displayed]

FINDINGS: The trachea is midline.  Heart size is mildly enlarged, as before.  Thoracic aorta is calcified.  Biapical and bibasilar pleuroparenchymal scarring.  COPD.
IMPRESSION: No acute findings.

## 2007-06-27 ENCOUNTER — Ambulatory Visit: Payer: Self-pay | Admitting: Pulmonary Disease

## 2007-06-27 DIAGNOSIS — E785 Hyperlipidemia, unspecified: Secondary | ICD-10-CM

## 2007-06-27 DIAGNOSIS — C61 Malignant neoplasm of prostate: Secondary | ICD-10-CM | POA: Insufficient documentation

## 2007-06-27 DIAGNOSIS — M1990 Unspecified osteoarthritis, unspecified site: Secondary | ICD-10-CM | POA: Insufficient documentation

## 2007-06-27 DIAGNOSIS — K5730 Diverticulosis of large intestine without perforation or abscess without bleeding: Secondary | ICD-10-CM | POA: Insufficient documentation

## 2007-06-27 DIAGNOSIS — D638 Anemia in other chronic diseases classified elsewhere: Secondary | ICD-10-CM | POA: Insufficient documentation

## 2007-06-27 DIAGNOSIS — M109 Gout, unspecified: Secondary | ICD-10-CM | POA: Insufficient documentation

## 2007-06-27 DIAGNOSIS — I10 Essential (primary) hypertension: Secondary | ICD-10-CM | POA: Insufficient documentation

## 2007-06-27 DIAGNOSIS — K219 Gastro-esophageal reflux disease without esophagitis: Secondary | ICD-10-CM

## 2007-06-27 DIAGNOSIS — I428 Other cardiomyopathies: Secondary | ICD-10-CM | POA: Insufficient documentation

## 2007-06-27 DIAGNOSIS — I679 Cerebrovascular disease, unspecified: Secondary | ICD-10-CM | POA: Insufficient documentation

## 2007-07-01 ENCOUNTER — Telehealth (INDEPENDENT_AMBULATORY_CARE_PROVIDER_SITE_OTHER): Payer: Self-pay | Admitting: *Deleted

## 2007-07-06 ENCOUNTER — Telehealth: Payer: Self-pay | Admitting: Pulmonary Disease

## 2007-07-11 ENCOUNTER — Telehealth (INDEPENDENT_AMBULATORY_CARE_PROVIDER_SITE_OTHER): Payer: Self-pay | Admitting: *Deleted

## 2007-07-14 ENCOUNTER — Telehealth: Payer: Self-pay | Admitting: Pulmonary Disease

## 2007-07-15 ENCOUNTER — Telehealth: Payer: Self-pay | Admitting: Pulmonary Disease

## 2007-07-18 ENCOUNTER — Ambulatory Visit: Payer: Self-pay | Admitting: Internal Medicine

## 2007-07-28 ENCOUNTER — Ambulatory Visit: Payer: Self-pay | Admitting: Internal Medicine

## 2007-08-07 ENCOUNTER — Ambulatory Visit: Payer: Self-pay | Admitting: Pulmonary Disease

## 2007-08-07 ENCOUNTER — Inpatient Hospital Stay (HOSPITAL_COMMUNITY): Admission: EM | Admit: 2007-08-07 | Discharge: 2007-08-18 | Payer: Self-pay | Admitting: Emergency Medicine

## 2007-08-07 ENCOUNTER — Ambulatory Visit: Payer: Self-pay | Admitting: Cardiology

## 2007-08-12 ENCOUNTER — Ambulatory Visit: Payer: Self-pay | Admitting: Internal Medicine

## 2007-08-18 ENCOUNTER — Telehealth: Payer: Self-pay | Admitting: Pulmonary Disease

## 2007-08-22 ENCOUNTER — Telehealth (INDEPENDENT_AMBULATORY_CARE_PROVIDER_SITE_OTHER): Payer: Self-pay | Admitting: *Deleted

## 2007-08-31 ENCOUNTER — Ambulatory Visit: Payer: Self-pay | Admitting: Pulmonary Disease

## 2007-08-31 LAB — CONVERTED CEMR LAB
BUN: 41 mg/dL — ABNORMAL HIGH (ref 6–23)
CO2: 28 meq/L (ref 19–32)
GFR calc Af Amer: 34 mL/min
GFR calc non Af Amer: 28 mL/min
Potassium: 4.9 meq/L (ref 3.5–5.1)

## 2007-09-02 ENCOUNTER — Encounter: Payer: Self-pay | Admitting: Pulmonary Disease

## 2007-09-04 DEATH — deceased

## 2007-09-12 ENCOUNTER — Encounter: Payer: Self-pay | Admitting: Pulmonary Disease

## 2010-07-26 ENCOUNTER — Encounter: Payer: Self-pay | Admitting: Pulmonary Disease

## 2010-07-27 ENCOUNTER — Encounter: Payer: Self-pay | Admitting: Gastroenterology

## 2010-11-18 NOTE — Consult Note (Signed)
[name], [name] [name]             ACCOUNT NO.:  [id number]   MEDICAL RECORD NO.:  [id number]          PATIENT TYPE:  INP   LOCATION:  3709                         FACILITY:  [facility]   PHYSICIAN:  Pricilla Riffle, MD, FACCDATE OF BIRTH:  [birth year]   DATE OF CONSULTATION:  01/24/2007  DATE OF DISCHARGE:                                 CONSULTATION   PRIMARY CARE PHYSICIAN:  Dr. Kriste Basque.   PRIMARY CARDIOLOGIST:  Dr. Rollene Rotunda.   CHIEF COMPLAINT:  Possible continuation of IV dobutamine.   HISTORY OF PRESENT ILLNESS:  And [name] is an 77 year old male  with a history of congestive heart failure, on chronic dobutamine.  He  was then admitted on July 17 with an infected Port-A-Cath and is status  post removal.  There was discussion about whether the dobutamine should  be continued, and cardiology is asked to see him.   [name] never gets chest pain.  His shortness of breath is at  baseline.  He states that he is not short of breath at rest and can walk  as much as 50 feet with a walker.  He can walk slightly left with a  cane.  He uses O2 at night, and after increased exertion when he feels  more short of breath.  [name] feels that his quality of life has  been better since he has been on the dobutamine, as he has not been  hospitalized for heart failure since it was started.   [name] was otherwise in his usual state of health, when a home  health nurse came out, and she noticed pus coming from his Port-A-Cath  site and made phone calls that lead to his admission.  He denies fevers,  chills or pain at the site.  He states that he tracks his weight daily  and prior to admission his weight had not increased.   PAST MEDICAL HISTORY:  1. Status post cardiac catheterization in 2002 with a 30% and 50% LAD,      luminal irregularities of the circumflex and RCA, medical therapy      for coronary artery disease recommended.  2. Diabetes.  3. Hypertension.  4.  Hyperlipidemia.  5. Remote history of tobacco use.  6. Family history of coronary artery disease in his siblings.  7. Non-ischemic cardiomyopathy with an EF of 25-30% by echocardiogram      in 2005.  8. Chronic systolic congestive heart failure, on a chronic dobutamine      drip since June 2007.  9. Status post Guidant biventricular ICD in November of 2005, with a      subsequent left pneumothorax requiring a chest tube.  10.Chronic kidney disease stage IV.  11.Chronic anticoagulation with Coumadin.  12.Hyperparathyroidism.  13.Gout.  14.Gastroesophageal reflux disease.  15.COPD on home O2.  16.History of DNR.  17.Osteoarthritis.  18.Chronic atrial fibrillation status post direct current      cardioversion x2.  19.Cerebrovascular disease of the left ICA 60% to 79%.  20.Anemia.   PAST SURGICAL HISTORY:  Status post cath as well as TURP, as well as  left THR,  as well as EGD/colon, as well as Bi-V ICD.   ALLERGIES:  He is intolerant to amiodarone and verapamil, with decreased  heart rate.  He is allergic or intolerant to penicillin, Restoril,  Biaxin, Ambien and Flagyl.   CURRENT MEDICATIONS:  1. Ventolin hand-held nebulizers q.i.d.  2. Zyloprim 100 mg daily.  3. Pulmicort b.i.d.  4. Calcitriol every other day.  5. Cipro 4 mg IV q.12 h.  6. D5W KVO.  7. Digoxin 0.125 mg Monday, Wednesday and Friday.  8. Lovenox 80 mg daily.  9. Lasix 40 mg p.o. b.i.d.  10.Amaryl 4 mg a day.  11.Mucinex 12 mg b.i.d.  12.Atrovent 0.5 mg q.i.d.  13.Imdur 120 mg daily.  14.Mag ox 400 mg a day.  15.Methyl prednisone 4 mg daily.  16.Protonix 40 mg a day.  17.Potassium 20 mEq a day.  18.Zocor 40 mg a day.  19.Dobutamine drip.  20.Januvia 25 mg q.a.c., which is his most recent drug.   REVIEW OF SYSTEMS:  Is significant for a rash that he states he has had  for more than a month.  He knows it was present in mid-June, when he saw  Dr. Kriste Basque.  He states that he has not gotten relief with  lotions, as  well as Benadryl or Atarax, and he is on chronic steroids.  He states  that the rash is on his trunk and was above the waist but now is  spreading downward, and he also has had itching on his arms as well.  His dyspnea on exertion is chronic with no recent changes.  He has  orthopnea but denies PND.  He has pedal edema. which is worse when he is  up on his feet, left greater than right.  He has no palpitations and has  no awareness of his atrial fibrillation.  His coughing and wheezing is  well controlled on his medications.  He has arthralgias.  He has not had  any reflux symptoms recently, with no hematemesis, hemoptysis or melena.  Review of systems is otherwise negative.   PHYSICAL EXAM:  VITAL SIGNS:  Temperature is 98.9, blood pressure  123/66, pulse 80, respiratory rate 20, O2 saturation 96% on 1 liter,  weight is 77.3 kg, 77.7 kg on admission.  GENERAL:  He is a well-developed elderly white male in no acute  distress.  HEENT is normal.  NECK:  His JVP is 9-10 cm.  There is no lymphadenopathy or thyromegaly,  noted that he has bilateral carotid bruits.  CVA:  His heart is regular rate and rhythm with an S1-S2, no definite S3  and a soft systolic murmur is noted.  LUNGS:  He has a  few rales in the bases, but they are essentially  clear.  SKIN:  He has a macular rash on his trunk.  The Port-A-Cath site is  dressed, and this was not disturbed.  He has some small areas of  ecchymosis on his upper extremities.  ABDOMEN:  Soft and nontender with active bowel sounds.  No  hepatosplenomegaly.  EXTREMITIES:  Distal pulses are intact in all four extremities, and no  edema is noted.  MUSCULOSKELETAL:  He has got no joint deformity or effusions, and no  spine or CVA tenderness is noted.  NEURO:  He is alert and oriented.  Cranial nerves II-XII are grossly  intact.   CHEST X-RAY:  Performed on July 17th showed chronic changes and COPD but  no acute disease.  EKG is atrial  fibrillation with  ventricular pacing,  rate of 70 and no acute ischemic changes.   LABORATORY VALUES:  Hemoglobin 10.5, hematocrit 30.9, WBCs 12.1,  platelets 238, sodium 138, potassium 3.5, chloride 106, CO2 24, BUN 38,  creatinine 2.27, glucose 102.  Cath tip culture shows greater than 100  colonies of Pseudomonas originosa.  INR today 1.2, GFR 28, MCV 87.7.   IMPRESSION:  Mr. Pitstick is a 77 year old male with a history of  coronary artery disease and nonischemic cardiomyopathy, on home  dobutamine.  He was admitted with a Port-A-Cath infection, which has  since been removed and now has a PICC line.  He is on IV dobutamine.  We  would recommend continuing the dobutamine, as the patient has done well  since it's initiation.  We will make sure that he is getting strict Is  and Os.  In the a.m. we will check a BNP, as well as a BMET.  His  appetite is down some, and his weight is slightly lower than it was in  the office, when he was 80 kg and is currently 77.3 kg.  At one point,  his Lasix was increased to 80 mg a.m. and 40 mg p.m., but he appears  euvolemic on the current dose, and no changes will be made.  Of note,  the Januvia dose has been adjusted for his renal function.  However, as  that was the last medication added, there is concern that his rash  started after he was begun on this medication.  He is on his home dose  of steroids with supplemental Benadryl, but the itching is not well  controlled.  He was a DNR during his previous admission, and this was  discussed with the patient and his wife.  The patient reiterated his  wishes to be a do not resuscitate, do not intubate.  We will leave these  issues to Dr. Kriste Basque.      Theodore Demark, PA-C      Pricilla Riffle, MD, [facility]  Electronically Signed    RB/MEDQ  D:  01/24/2007  T:  01/24/2007  Job:  [id number]   cc:   [name], MD

## 2010-11-18 NOTE — Assessment & Plan Note (Signed)
[facility]                          CHRONIC HEART FAILURE NOTE   NAME:Backer, AKEEN LEDYARD                    MRN:          [id number]  DATE:07/28/2007                            DOB:          1924-02-08    PRIMARY CARDIOLOGIST:  Dr. Nicholes Mango   PRIMARY CARE:  Dr. Alroy Dust   Mr. Ertl returns today for further follow-up of his congestive heart  failure which is end stage class IV [employer]. He  continues to remain on dobutamine therapy via PICC line under the care  of hospice.  I last saw Mr. Overfelt on January 12 at which time he was  having fever, chills, productive cough.  He was treated with Avelox for  5 days. Today, he returns for follow-up and states he is feeling much  better. Still very weak, but almost back to his baseline status.  Denies  any further episodes of fever, chills, does have a cough.  No longer  productive. Still sleeping quite well.  Appetite has been good. Only  complains of generalized weakness.   PAST MEDICAL HISTORY:  1. Congestive heart failure secondary to nonischemic cardiomyopathy      with ejection fraction of 30% status post placement of Bi-V      implantable cardioverter defibrillator without improvement in      symptoms. Now, a hospice patient, being maintained on dobutamine      for positive inotropic therapy in the setting of end-stage heart      failure.  2. Chronic atrial fib.  3. Chronic renal insufficiency followed by Dr. Eliott Nine.  4. Chronic anticoagulation therapy.  5. DNR status.  6. Diabetes.  7. Chronic obstructive pulmonary disease with O2 and nebulizer      treatments p.r.n.  8. Nonobstructive coronary artery disease by cath.  9. Ongoing dermatological rash fairly pruritic in nature with unclear      etiology.   REVIEW OF SYSTEMS:  As stated above.   CURRENT MEDICATIONS:  1. Furosemide 80 mg one in the a.m. and half in this afternoon.  2. Klor-Con 20 mEq daily.  3.  Methylprednisone 4 mg daily.  4. Isosorbide 120 mg daily.  5. Warfarin per Coumadin clinic.  6. Lantus insulin 16 units q. a.m.  7. Senokot 2 tablets nightly  p.r.n.  8. Humalog sliding scale insulin.  9. Albuterol or Atrovent nebs q.i.d. p.r.n.  10.Doxepin 25 mg b.i.d.  11.Loratadine 10 mg b.i.d.  12.Hydroxyzine 25 mg b.i.d.  13.Qvar 80 mcg 2 sprays b.i.d.  14.Dexamethasone  cream 25% topical b.i.d.  15.Lorazepam 0.5 mg q.12 h.  16.P.r.n. medications include Combivent, tramadol Zofran and Mucinex.   PHYSICAL EXAM:  Weight 169 pounds, blood pressure 130/67, heart rate  recorded 48,  apical pulse in the 60s.  Mr. Bugaj is in no acute  distress.  He is alert and oriented, sitting in his wheelchair, without  any respiratory compromise.  No JVD noted at a 90 degree angle.  LUNGS:  He has scattered rhonchi in the left lobe, otherwise clear to  auscultation.  Cardiovascular exam reveals S1-S2, irregular rhythm.  ABDOMEN:  Soft, nontender, positive bowel sounds.  LOWER EXTREMITIES:  Without clubbing, cyanosis.  He has +1 pitting edema  bilaterally.  Neurologically alert and oriented x3.   IMPRESSION:  Congestive heart failure with class VI symptoms requiring  dobutamine therapy under the care of Hospice. Continue current  medications. Will follow-up in 3 weeks with the patient, sooner if there  is any change in status.  Family has my phone number and knows to call  us if needed.      Dorian Pod, ACNP  Electronically Signed      Rollene Rotunda, MD, [facility]  Electronically Signed   MB/MedQ  DD: 07/28/2007  DT: 07/28/2007  Job #: [id number]   cc:   Bevelyn Buckles. Bensimhon, MD

## 2010-11-18 NOTE — Assessment & Plan Note (Signed)
[facility]                             PULMONARY OFFICE NOTE   NAME:[name] [name], [name] [name]                    MRN:          [id number]  DATE:04/12/2007                            DOB:          [birth year]    HISTORY OF PRESENT ILLNESS:  The patient is an 77 year old very  complicated male patient of Dr.  Jodelle Green who has a known history of  congestive heart failure on chronic IV dobutamine at home, coronary  artery disease, diabetes mellitus, hypertension, hyperlipidemia,  cardiomyopathy, status post biventricular ICD in 2005 and chronic kidney  disease stage IV and atrial fibrillation-on chronic Coumadin therapy.  The patient returns today for a 2 week followup. At last visit, the  patient was having difficulty with unsteadiness and persistent pruritus.  The patient had believed that Amaryl might be contributing to his  increased pruritus. Therefore, this was held and the patient was  discontinued off of Xanax and recommended to use Zyrtec as needed for  itching. The patient unfortunately was unable to tolerate this regimen  and contacted his dermatologist, which placed him on prednisone and  restarted him on doxepin. The patient reports that he does feel much  improved. The pruritus has decreased. His unsteadiness has improved  substantially as well. The patient denies any chest pain, orthopnea, PND  or leg swelling. Blood sugars have been elevated over the last few days  since starting prednisone. The patient is currently on a sliding scale  insulin.   PAST MEDICAL HISTORY:  Reviewed.   CURRENT MEDICATIONS:  Reviewed.   PHYSICAL EXAMINATION:  The patient is chronically ill appearing male in  no acute distress. He is afebrile with stable vital signs. O2 saturation  is 94% on room air.  HEENT: Unremarkable. Left EAC has a small amount of ear wax.  NECK: Supple without cervical adenopathy. No JVD.  LUNGS: Lung sounds reveal diminished breath sounds in  the bases.  CARDIAC: Regular rate.  ABDOMEN: Soft and nontender.  EXTREMITIES: Warm without any calf cyanosis, clubbing. There is trace  edema bilaterally.   IMPRESSION/PLAN:  1. Chronic pruritus. The patient symptoms do seem to be improved with      prednisone pack. The patient is to continue to followup with      dermatology as recommended. The patient is tolerating doxepin      without notable adverse effects. May continue this at bedtime as      needed and will use Claritin during the day if has any allergy or      pruritus symptoms. The patient is to maintain off of Benadryl,      Zyrtec and hydroxyzine. The patient may restart Amaryl because his      symptoms did not improve off of this over the last two weeks.  2. Diabetes mellitus. Recent increased blood sugar on steroids. The      patient is tapering off of predinsone today. I have recommended      that he increase Januvia up to 100 mg daily and maintain on Amaryl      2 mg daily. The patient will use  the sliding scale insulin as      needed. The patient is advised on a diabetic diet.  3. Left cerumen impaction. Irrigation was given today. Small amount of      wax was removed. The patient is to continue to use over-the-counter      products as needed.  4. Complex medication regimen. The patient's medications were reviewed      in detail. The patient education was provided. Computerized      medication calendar was adjusted and reviewed with this patient.      Rubye Oaks, NP  Electronically Signed      [name], MD  Electronically Signed   TP/MedQ  DD: 04/12/2007  DT: 04/12/2007  Job #: [id number]

## 2010-11-18 NOTE — Discharge Summary (Signed)
[name], [name] [name]             ACCOUNT NO.:  [id number]   MEDICAL RECORD NO.:  [id number]          PATIENT TYPE:  INP   LOCATION:  3709                         FACILITY:  [facility]   PHYSICIAN:  [name], MD     DATE OF BIRTH:  1924/03/11   DATE OF ADMISSION:  01/20/2007  DATE OF DISCHARGE:  01/28/2007                               DISCHARGE SUMMARY   FINAL DIAGNOSIS:  1. Admitted 01/20/2007 with an infected right Port-A-Cath.  This line      had been placed 12/25/2005 by Interventional Radiology.  Care by      [facility] yielded pus, and cultures grew Pseudomonas      sensitive to Cipro.  Patient was treated with IV Cipro in the      hospital and switched to p.o. Cipro on discharge.  Port-A-Cath was      removed by Interventional Radiology with plans for antibiotic      treatment, wound management by [facility], and followup by      Cardiology and Interventional Radiology  with plans to reinsert a      Hickman catheter when the site heals sufficiently.  He currently      has a PICC line in his left arm but can not get a catheter placed      in the left upper chest due to implanted defibrillator.  2. 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  He is on chronic      dobutamine infusion intravenously and has an AICD, and he is      followed by Dr. Gala Romney in the congestive heart failure clinic.  3. Arteriosclerotic peripheral vascular disease with known renal      artery stenosis.  4. Atrial fibrillation on Coumadin.  5. Hypertension controlled on therapy.  6. Hypercholesterolemia on Lipitor.  7. Renal insufficiency with known renal artery stenosis; creatinine in      the 2.5 range; the patient is seen by Dr. Eliott Nine for Nephrology.  8. History of prostate cancer with surgery in 1990's.  9. History of severe COPD and asthmatic bronchitis on home oxygen and      home nebulizer therapy.  10.History of degenerative arthritis and gout with a previous left      total knee replacement.  11.History of gastroesophageal reflux disease.  12.History of diverticulosis.  13.History of marked debilitation and failure to thrive; the patient      is on hospice home services.  14.ALLERGY TO PENICILLIN.   BRIEF HISTORY AND PHYSICAL:  The patient is an 77 year old gentleman  known to [place] with multiple medical problems as noted above.  His hospice  nurse called stating that she drained pus from his right Port-A-Cath  while servicing the line.  Review of the history revealed it was placed  by Dr. Kearney Hard for Intervention Radiology on December 25, 2005.  He had been  doing quite well until the pus was discovered.  The patient denied any  fevers, chills, or sweats.  The area was slightly red and tender.  He  was admitted for IV antibiotics, removal of the line, and insertion of a  PICC line for which to infuse his dobutamine.   PAST MEDICAL HISTORY:  As noted, he has arteriosclerotic heart disease  with a severe nonischemic dilated cardiomyopathy and chronic congestive  heart failure.  He has an AICD placed and is on chronic dobutamine  infusions under the care Dr. Gala Romney.  He has known arteriosclerotic  peripheral vascular disease with renal artery stenosis.  He has atrial  fibrillation on Coumadin.  He has hypertension controlled on  medications.  He has hypercholesterolemia on Lipitor.  He has known  renal insufficiency with renal artery stenosis as noted and a creatinine  around 2.5.  He sees Dr. Eliott Nine periodically for Nephrology.  He had  prostate cancer surgery in 1990 and no known recurrence.  He has severe  COPD and asthmatic bronchitis and is on home oxygen and home nebulizer  treatments.  He has history of degenerative arthritis and gout and had a  left total knee operation in the past.  He has gastroesophageal reflux  disease and diverticulosis.  He has marked debilitation and  failure to  thrive and is on [facility].  HE IS ALLERGIC TO PENICILLIN.   PHYSICAL EXAMINATION:  Physical examination at time of admission  revealed a chronically ill-appearing 77 year old gentleman in no acute  distress.  Vital Signs:  Blood pressure 128/60, pulse 70 per minute and  regular, respirations 20 per minute and not labored, temperature 98  degrees.  The right Port-A-Cath was present, slightly red, minimally  tender, and had pus drained by a needle.  HEENT exam was negative.  There were no exudates seen.  Neck exam showed jugular venous  distension, 1+ carotid bruit, no thyromegaly or lymphadenopathy.  Chest  exam revealed decreased breath sounds, scattered rhonchi bilaterally.  No signs of consolidation.  Cardiac exam revealed regular rhythm, grade  1/6 systolic ejection murmur at the  left sternal border, S4 gallop, no  rubs.  Abdomen was soft and nontender without evidence of organomegaly  or masses.  Extremities showed no cyanosis or clubbing.  There was trace  edema, degenerative changes, and previous total knee surgery as noted.  Neurologic exam was negative without focal abnormalities detected.  Skin  exam revealed multiple ecchymoses.   LABORATORY DATA:  EKG showed electronic ventricular pacemaker.  Chest x-  ray showed a PICC line in the distal SVC, and chronic lung changes/COPD  but no acute process, pacer wires, and AICD stable, Port-A-Cath noted on  the right.  Hemoglobin 11.6, hematocrit 34.2, white count 8700 with 80%  segs.  Pro time 19.5, INR 1.6, PTT 43 seconds.  Sodium 138, potassium  4.0, chloride 105, CO2 25, BUN 36, creatinine 2.4, blood sugar 168,  calcium 9.1, total protein 6.4, albumin 3.7, AST 17, ALT 88, alkaline  phosphatase 78, total bilirubin 0.9, magnesium 2.4.  Hemoglobin A1c 7.2.  CPK 54 with negative MB and negative troponin.  BNP of 389, repeat 445.  TSH 2.15.  Urinalysis clear.  Blood cultures negative x2.  A Port-A-Cath  tip grew  greater than 1000 colonies of Pseudomonas sensitive to Cipro.   HOSPITAL COURSE:  The patient was admitted with infected Port-A-Cath,  initially started on Kefzol IV.  When culture reports showed  Pseudomonas, he was switched to IV Cipro.  He was continued on his home  meds.  Blood sugars were monitored with sliding scale coverage as  needed.  The PICC line  was evaluated by Interventional Radiology and  removed.  The wound site was cleansed daily with normal saline and  dressed with Hydro-Jel and Tegaderm.  He remained afebrile throughout  his hospital course, and plans were made for replacement of a long-term  line due to the need for continued infusion of dobutamine.  A new  central line could not be placed on the left chest area because of his  pacemaker/AICD device.  A PICC line was inserted on admission for use  temporarily, and plans are to replace a catheter in the right upper  chest area once the wound has healed sufficiently to allow this.  He  will be rechecked by Dr. Gala Romney, the congestive heart failure clinic,  and Interventional Radiology.   MEDICATIONS AT DISCHARGE:  1. Cipro 500 mg p.o. b.i.d. until gone.  2. IV dobutamine infusion via PICC line with line care per Hospice      Home Care and dressing changes in the right upper chest, former      Port-A-Cath site by [facility] Care team.  3. Coumadin to continue same dose as previously.  4. Digoxin 0.125 mg p.o. every Monday, Wednesday, and Friday.  5. Lasix 40 mg p.o. b.i.d..  6. Hydralazine 50 mg p.o. t.i.d..  7. Imdur 120 mg p.o. q.a.m..  8. Lipitor 20 mg p.o. daily.  9. Home nebulizer treatment with DuoNeb q.i.d. and budesonide 0.25 mg      b.i.d..  10.Oxygen 2 liters a minute by nasal cannula.  11.Medrol 4 mg p.o. daily.  12.Guaifenesin 2 tablets p.o. b.i.d. with plenty of fluids.  13.Omeprazole 20 mg p.o. daily.  14.Senokot 2 tablets p.o. q.h.s..  15.Allopurinol 100 mg p.o. daily.  16.Calcitriol 0.25 mg  p.o. every other day.  17.Magnesium 400 mg p.o. daily  18.K 20 one tablet daily.  19.Januvia 100-mg tablets, one-half tablet p.o. daily.  20.Glipizide 4-mg  tablets, one tablet p.o. daily.   DISPOSITION:  The patient will follow up in the CHF clinic with Dr.  Gala Romney and the Coumadin Clinic on Wednesday, July 13 at 10:30 a.m..   CONDITION ON DISCHARGE:  Improved.      [name], MD  Electronically Signed     SMN/MEDQ  D:  01/28/2007  T:  01/29/2007  Job:  [id number]   cc:   Bevelyn Buckles. Bensimhon, MD

## 2010-11-18 NOTE — H&P (Signed)
[name], [name] [name]             ACCOUNT NO.:  [id number]   MEDICAL RECORD NO.:  [id number]          PATIENT TYPE:  INP   LOCATION:  4710                         FACILITY:  [facility]   PHYSICIAN:  Bevelyn Buckles. Bensimhon, MDDATE OF BIRTH:  [birth year]   DATE OF ADMISSION:  05/30/2007  DATE OF DISCHARGE:                              HISTORY & PHYSICAL   PRIMARY CARE PHYSICIAN:  Dr. Alroy Dust   PRIMARY CARDIOLOGIST:  Dr. Nicholes Mango   CHIEF COMPLAINT:  Shortness of breath.   HISTORY OF PRESENT ILLNESS:  [name] [name] is an 77 year old male with a  history of coronary artery disease and systolic congestive heart  failure.  He was last weighed last Thursday and noted at that time his  weight was up about 5 pounds.  He has also noted increased dyspnea on  exertion.  Today he became short of breath at rest.  He came to the  emergency room where he is currently feeling less short of breath on O2.  He had not had any chest pain.  He has been compliant with his  medications including his Lasix.   [name] [name] that for about the past 5 weeks he has had  episodic fevers, chills, weakness and nausea.  These episodes are in  association with his PICC line either being flushed or the medication  been changed out.  He denies fevers, chills or nausea at other times,  although he has felt a little weaker than usual.  He was seen by  cardiology and started on Avelox for a 7-day course for possible  tracheobronchitis because he was coughing more than usual.  These  antibiotics were continued for 10 days by primary care because the  tracheobronchitis was slow to resolve.  He has not had a productive  cough.  Currently, he is afebrile.   PAST MEDICAL HISTORY:  1. History of nonischemic cardiomyopathy with an EF of approximately      30%.  2. Status post Guidant biventricular ICD in November 2005 with a      subsequent left pneumothorax.  3. Status post cardiac catheterization in May 10, 2004, showing      nonobstructive coronary artery disease and medical therapy      recommended.  4. Diabetes.  5. Hypertension.  6. Hyperlipidemia.  7. Remote history of tobacco use.  8. Family history of coronary artery disease in siblings.  9. Chronic systolic congestive heart failure on chronic dobutamine      drip.  10.CKD stage 4.  11.Chronic anticoagulation with Coumadin secondary to atrial      fibrillation.  12.History of atrial fibrillation status post cardioversion x2.  13.Hyperparathyroidism.  14.Gout.  15.Gastroesophageal reflux disease.  16.COPD on home O2.  17.Osteoarthritis.  18.Cerebrovascular disease with the left ICA 60-79%.  19.History of anemia.  20.History of being a DNR.   SURGICAL HISTORY:  He is status post cardiac catheterizations as well as  TURP, THR, EGD, colonoscopy and biventricular ICD.   ALLERGIES:  He is allergic or intolerant to:  1. AMIODARONE.  2. VERAPAMIL.  3. PENICILLIN.  4. RESTORIL.  5. BIAXIN.  6. AMBIEN.  7. FLAGYL.   CURRENT MEDICATIONS:  1. Digoxin 0.125 mg Monday, Wednesday and Friday.  2. Warfarin per the Coumadin Clinic.  3. Furosemide 80 mg a.m. 40 mg p.m.  4. Lipitor 20 mg daily.  5. Isosorbide 120 mg daily.  6. Calcitriol 0.25 mcg every-other day.  7. Guaifenex DM two tablets b.i.d.  8. Amaryl 4 mg daily.  9. Prilosec 20 mg a day.  10.Dobutamine 2.5 mcg/kg per minute.  11.Senokot two tablets daily.  12.Albuterol and Atrovent nebulizers q.i.d.  13.Budesonide nebulizers b.i.d.  14.Januvia 100 mg daily.  15.Methylprednisone 4 mg daily.  16.Klor-Con 20 mEq a day.  17.Mag-Ox 400 mg a day.   SOCIAL HISTORY:  He lives in [place] with his wife and is retired  from [employer] and also used to work as a Development worker, international aid for [employer].  He has a  greater than 50 pack-year history of tobacco use but quit in 2004 and  has not abused alcohol or drugs.   FAMILY HISTORY:  Both of his parents died of cancer but he has three  siblings  with a history of coronary artery disease and two out of three  of those are deceased.   REVIEW OF SYSTEMS:  Significant for fevers, chills and sweats.  His  shortness of breath is described above.  He also has orthopnea but  denies PND.  He has noticed some lower extremity edema recently.  He has  not had presyncope or syncope.  He has a cough and has had some  yellowish sputum but denies wheezing at this time.  He has had episodic  tingling in his right arm with cramping in his hand.  He has had  weakness recently.  He has a history of arthralgias associated with his  arthritis and also has problems with constipation.  Full 14-point review  of systems is otherwise negative.   PHYSICAL EXAMINATION:  VITAL SIGNS:  Temperature is 98.7, initial blood  pressure 114/52, pulse 88, respiratory rate 22, O2 saturation 93% on  room air.  GENERAL:  He is a frail, elderly white male in no acute distress on O2.  HEENT:  Normal.  NECK:  There is no lymphadenopathy or thyromegaly noted.  He has a left  carotid bruit and JVD is noted at 8 cm.  HEART:  Irregular rate and rhythm and an S1 and S2 are noted.  LUNGS:  He has rales in the bases and no wheezing is heard.  ABDOMEN:  Soft and nontender with active bowel sounds.  SKIN:  The PICC line site is without erythema, exudate or ecchymosis.  He has some small areas of ecchymosis on his arms and legs.  No wounds  or drainage are noted.  EXTREMITIES:  There is no cyanosis or clubbing noted but he has 1-2+  pitting edema, left greater than right, in both lower extremities.  MUSCULOSKELETAL:  There is no joint deformity or effusion and no spine  or CVA tenderness.  NEUROLOGIC:  He is alert and oriented.  Cranial nerves II-XII grossly  intact.   Chest x-ray shows right PICC line coiled in the right neck.  Emphysema  with bibasilar atelectasis is also seen.   EKG is underlying atrial fibrillation with occasional paced beat.  He  has a left  bundle-branch block.   LABORATORY VALUES:  Hemoglobin 10.1, hematocrit 31.0, wbc's 18.7 with a  left shift.  Sodium 139, potassium 4.3, chloride 109, CO2 23, BUN 25,  creatinine 2.18, glucose  205.  D-dimer 1.89.  INR 1.8.   IMPRESSION:  Shortness of breath.  Mr. Hedman appears to have mild  volume overload.  His fevers and chills are concerning for PICC line  infection with intermittent bacteremia.  Blood cultures from last week  are negative so far.  As he was on Avelox at that time, we will redraw  the blood cultures.  He will be started on IV vancomycin and Avelox per  pharmacy.  We will continue him on his other home medications with  pharmacy managing his Coumadin.  His PICC line will be discontinued and  the tip cultured.  Will check a sed rate as well as other labs and  follow his white count closely.  He will be gently diuresed with one  dose of IV Lasix and follow his kidney function, blood pressure and  potassium closely.  He will be continued on IV dobutamine.      Theodore Demark, PA-C      Bevelyn Buckles. Bensimhon, MD  Electronically Signed    RB/MEDQ  D:  05/30/2007  T:  05/31/2007  Job:  [id number]   cc:   [name], MD

## 2010-11-18 NOTE — H&P (Signed)
[name], [name] [name]             ACCOUNT NO.:  [id number]   MEDICAL RECORD NO.:  [id number]          PATIENT TYPE:  INP   LOCATION:  5503                         FACILITY:  [facility]   PHYSICIAN:  Barbaraann Share, MD,FCCPDATE OF BIRTH:  08-25-25   DATE OF ADMISSION:  08/07/2007  DATE OF DISCHARGE:                              HISTORY & PHYSICAL   HISTORY OF PRESENT ILLNESS:  The patient is a very pleasant 77 year old  gentleman who I have been asked to admit for bilateral pneumonia.  The  patient has known end-stage cardiomyopathy for which he is on continuous  dobutamine infusion as well as COPD, and has noted a persistent cough  and congestion over the last few weeks.  He was treated with Avelox as  an outpatient at the cardiology office with some improvement.  However,  over the last 2-3 days he has had increased weakness, shortness of  breath, and increasing chest congestion.  He coughs purulent material on  a daily basis but thinks that it is a little more than his normal  baseline.  He did start having fever at home last night to 00.8.  Hospice was called and saw him today felt like he needed to come to be  evaluated.   PAST MEDICAL HISTORY:  1. Significant for his end-stage nonischemic cardiomyopathy.  He is      status post biventricular pacer and is on continuous dobutamine.  2. Chronic atrial fibrillation.  3. History of COPD for which he is on chronic oxygen and steroids.  4. Chronic renal insufficiency.  5. Hypertension.  6. Diabetes.  7. Cerebrovascular disease.   The patient is allergic or intolerant to PENICILLIN, FLAGYL, BIAXIN,  RESTORIL and AMBIEN.   CURRENT MEDICATIONS:  1. Furosemide 80 mg the morning and 40 mg in the evening.  2. Klor-Con 20 mEq daily.  3. Methylprednisolone 4 mg daily.  4. Isordil Extended Release 120 mg daily.  5. Warfarin 5 mg per day.  6. Lantus insulin 16 units a.m. only.  7. Senokot two p.o. at bedtime p.r.n.  8. Humalog insulin  sliding scale.  9. Albuterol Atrovent nebulizer treatment q.i.d. and p.r.n.  10.Dobutamine 2.5 mcg/min. on a chronic basis through a PICC line.  11.Lorazepam 0.5 mg in the morning and in the evening.  12.Doxepin 25 mg a.m. and p.m.  13.Loratadine 10 mg b.i.d.  14.Hydroxyzine 25 mg b.i.d.  15.Tramadol 50 mg q.6h. p.r.n.  16.Zofran 4 mg 1/2 tablet q.4h. p.r.n.  17.Mucinex p.r.n.   SOCIAL HISTORY:  The patient is married and lives in [place].  He is  retired from [employer] and also was the [employer] for [employer].  He  has a long history of tobacco abuse and has not done so since 2004.   FAMILY HISTORY:  Remarkable for coronary disease as well as cancer.   REVIEW OF SYSTEMS:  As per the history of present illness.  Also see  patient intake form.   PHYSICAL EXAM:  GENERAL:  He is a frail-appearing male in no acute  distress.  Blood pressure was 119/61, pulse 98, temperature 98.4, respiratory rate  is 22, O2 saturations are 95% on oxygen.  HEENT:  Pupils equal, round and reactive to light and accommodation.  Extraocular muscles are intact.  Nares patent without discharge.  Oropharynx is clear.  NECK:  Supple without lymphadenopathy or thyromegaly.  CHEST:  Mildly decreased breath sounds and basilar crackles, left much  greater than right.  CARDIAC:  Frequent ectopic and a 2/6 systolic murmur.  ABDOMEN:  Soft, nontender, with good bowel sounds.  GENITAL, RECTAL, BREASTS:  Not done and not indicated.  LOWER EXTREMITIES:  Trace edema with varicosities.  Pulses are intact  distally but diminished.  NEUROLOGIC:  He is alert and oriented and moves all four extremities.   LABORATORY DATA:  Chest x-ray shows severe changes of emphysema with  pacer in place and bilateral lower lobe infiltrates, left greater than  right.  Laboratory data shows a sodium of 138, potassium 4.6, chloride  105, bicarb 22, glucose 127, BUN 48, creatinine 2.78, calcium of 8.5.  Brain natriuretic peptide is  698.  He had a white blood cell count of  23,000 with hemoglobin of 10.7, hematocrit of 33.1 and a platelet count  of 315,000.   IMPRESSION:  Bilateral community-acquired pneumonia in a patient with  end-stage cardiomyopathy and O2-dependent emphysema.  At this point in  time he will need admission for IV antibiotics as well as more  aggressive therapy.  We will continue his usual home medications and  will also maintain his no Code Blue Status, which he verified today.   PLAN:  1. Admit for IV antibiotics with Primaxin and Avelox.  2. Continue bronchodilator regimen as well as low-dose Medrol.  3. Will inform cardiology of his admission.      Barbaraann Share, MD,FCCP  Electronically Signed     [facility]MC/MEDQ  D:  08/07/2007  T:  08/08/2007  Job:  [id number]   cc:   [name], MD

## 2010-11-18 NOTE — Assessment & Plan Note (Signed)
[facility]                          CHRONIC HEART FAILURE NOTE   NAME:[name] [name] LOUGH                    MRN:          [id number]  DATE:01/04/2007                            DOB:          [birth year]    [name] [name] returns today for followup of his congestive heart failure  which is end-stage. He is currently being maintained on a dobutamine  drip under the care of hospice. [name] [name] has been doing quite well.  He continues to have a nonproductive cough, previously been treated with  a round of antibiotics with Dr. Alroy Dust, his primary care physician.  I also had obtained a CT scan status post abnormal chest x-ray back in  May. Chest x-ray had shown cardiac enlargement, COPD, with a linear  densities in the right lower lung zone. Followup CT scan showed small 4  mm anterior right mild lobe nodule likely representing small area of  nodular scarring. Followup CT in 6 months to confirm stability  recommended. [name] [name] has recently been seen by Dr. Kriste Basque primary  care and also by Dr. Eliott Nine his renal physician, and states he has  received good reports both times. He complains of some peripheral edema,  apparently Dr. Eliott Nine had him increase his Lasix from 40 mg b.i.d. to 80  mg q.a.m. and 40 in the evening times 1 week. Patient states he has been  taking this dose for 5 days, has been urinating more, but still having  some peripheral edema.   PAST MEDICAL HISTORY:  Includes;  1. Congestive heart failure secondary to non ischemic cardiomyopathy      with ejection fraction of 30%, status post placement of a      biventricular ICD without much improvement in symptoms, currently      being maintained on dobutamine drip in the setting of end-stage      heart failure under the care of hospice.  2. Chronic atrial fibrillation.  3. Chronic renal insufficiency followed by Dr. Eliott Nine.  4. Chronic anticoagulation therapy with Coumadin.  5. DNR  status.  6. Diabetes.  7. COPD.  8. Nonobstructive coronary artery disease by catheterization.   REVIEW OF SYSTEMS:  As stated above.   CURRENT MEDICATIONS:  Include;  1. Digoxin 0.125 mg on Mondays, Wednesdays, and Fridays.  2. Warfarin per Coumadin clinic.  3. Furosemide 40 mg b.i.d.  4. Lipitor 20 mg at bedtime.  5. Isorbid 120 mg daily.  6. 8.    Calcitriol 0.625 mg every other day.  7. Guaifenex DM 2 tablets b.i.d.  8. Amaryl 4 mg daily.  9. Prilosec 20 mg daily.  10.Dobutamine mic drip.  11.Senokot 2 tablets at bedtime.  12.Albuterol nebs q.i.d.  13.Atrovent nebs q.i.d.  14.Budesonide nebs b.i.d.  15.Januvia 100 mg daily.  16.Methylprednisone 4 mg daily.  17.Klor-con 20 mEq daily.  18.Hydralazine 50 mg t.i.d.  P.r.n. medications include; allopurinol, Combivent, tramadol, Darvocet,  loratadine, hydroxyzine.   Review of systems as stated above.   PHYSICAL EXAMINATION:  Weight 176, blood pressure 120/70 with a heart  rate of 68. [name] [name] is in no acute distress.  He is his usual  pleasant self.  No signs of jugular venous distension at 45 degree angle.  LUNGS: He has scattered rhonchi throughout.  CARDIOVASCULAR EXAM: Reveals an S1 and S2, regular rhythm at this time.  Positive S3.  ABDOMEN: Soft, nontender. Positive bowel sounds.  LOWER EXTREMITIES: +2 pitting edema in bilateral lower extremity/ ankle  area.   IMPRESSION:  Heart failure with mild volume overload at this time. We  will have patient continue the Lasix at 80 in the morning and 40 in the  evening. We will have hospice nurse draw a BMET and a BNP next week.  Patient has not see Dr. Graciela Husbands since July of 2007. We will have him  follow up with Dr. Graciela Husbands for a pacer clinic visit and otherwise continue  current medications.      Dorian Pod, ACNP  Electronically Signed      Rollene Rotunda, MD, [facility]  Electronically Signed   MB/MedQ  DD: 01/04/2007  DT: 01/05/2007  Job #: [id number]   cc:    Duke Salvia. Eliott Nine, [name]

## 2010-11-18 NOTE — Assessment & Plan Note (Signed)
[facility]                          CHRONIC HEART FAILURE NOTE   NAME:[name] [name], [name] [name]                    MRN:          [id number]  DATE:03/22/2007                            DOB:          1924-05-14    PRIMARY CARE PHYSICIAN:  [name], [name]   PRIMARY CARDIOLOGIST:  Bevelyn Buckles. Bensimhon, [name]   RENAL PHYSICIAN:  Aram Beecham B. Eliott Nine, [name]   [name] returns today for follow up of his congestive heart failure  which is secondary to nonischemic cardiomyopathy with an EF currently  30%.  [name] is end-stage heart failure under the care of Hospice  with home dobutamine therapy via PICC line.  The patient has had a slow  decline in his overall health over the last few months.  He returns  today accompanied by his wife and daughter who are concerned about his  increased memory impairment and overall increased weakness.  The patient  is not sleeping well at night, has an ongoing rash that is very pruritic  in nature, being followed by Dr. Joseph Art with dermatology.  The patient  denies any increased shortness of breath, orthopnea, or PND.  No acute  changes in overall status, just a slow decline.  Mrs. Lozito states  the patient did have a low grade temperature a few days ago.  A call was  placed to Dr. Kriste Basque who started the patient on Levaquin.   PAST MEDICAL HISTORY:  1. Congestive heart failure secondary to nonischemic cardiomyopathy      with EF currently 30%.  2. DO NOT RESUSCITATE, NO CODE BLUE status on home dobutamine therapy      via PICC line under the care of Hospice.  3. Chronic renal insufficiency.  4. Chronic atrial fibrillation requiring chronic anticoagulation      therapy.  5. Severe chronic obstructive pulmonary disease, O2 dependent.  6. Nonobstructive coronary artery disease by cardiac catheterization.  7. Status post recent explantation of a Port-A-Cath to the right chest      secondary to pseudomonas infection.  8.  Diabetes.  9. Recent onset of anemia with unclear etiology, post hemoccult      negative stool during ER visit earlier last month.  10.Status post placement of a Guidant biventricular implantable      cardioverter defibrillator with cardiac resynchronization therapy      in December of 2005 with cardioverter defibrillator currently      turned off, pacemaker still activated.   REVIEW OF SYSTEMS:  As stated above.   CURRENT MEDICATIONS:  1. IV dobutamine infusion via PICC line.  2. Coumadin per Coumadin clinic.  3. Digoxin 0.125 mg Monday/Wednesday/Friday.  4. Lasix 40 mg b.i.d.  5. IMDUR 120 mg daily.  6. Lipitor 20 mg daily.  7. DuoNeb q.i.d.  8. Budesonide inhaled b.i.d.  9. O2 3 liters p.r.n.  10.Medrol 4 mg daily.  11.Guaifenesin two tablets b.i.d.  12.Prilosec 20 mg daily.  13.Senokot two tablets q.h.s.  14.Calcitriol 0.25 mg every other day.  15.Levaquin 150 mg tablet daily.  The patient has two more days.  16.Insulin as directed.  17.Hydrocortisone  1% cream daily.  18.Pulmicort inhaled b.i.d.  19.Glipizide 4 mg daily.  20.Januvia 50 mg daily.  21.Potassium 20 mEq daily.  22.Magnesium 400 mg daily.   P.R.N MEDICATIONS:  Acetaminophen, Phenergan, Milk of Magnesia,  tramadol, Darvocet, Restoril, allopurinol, Combivent, Claritin.   LABORATORY DATA:  Potassium 3.8, glucose 168, BUN 47, creatinine 1.89,  magnesium 2.0, BNP 474.  This blood work was obtained on March 15, 2007, through Hospice.   PHYSICAL EXAMINATION:  VITAL SIGNS:  Weight 165, blood pressure 108/58,  heart rate 66.  GENERAL:  The patient is in no acute distress.  He is his usual pleasant  self, sitting upright in wheelchair, O2 off at this time.  He is  currently alert and oriented x3.  NECK:  No signs of jugular venous distention at 90 degree angle.  LUNGS:  Clear with somewhat distant breath sounds in bilateral bases.  I  do not hear any wheeze, rales, or rhonchi.  CARDIOVASCULAR:  S1 and S2  regular rate and rhythm.  I do not hear S3 at  this time.  ABDOMEN:  Soft, nontender, and positive bowel sounds.  EXTREMITIES:  Without clubbing or cyanosis.  He has a trace of edema  bilaterally lower extremities.   IMPRESSION:  Congestive heart failure currently without signs of  excessive volume overload.  We will continue home dobutamine.  No beta  blockers secondary to decreased cardiac output in the setting of end-  stage heart failure.  I am going to have the patient give the patient an  extra dose of Lasix today and tomorrow as he has some mild fluid  retention in the lower extremities.  I also will renew his prescription  for Tramadol.  The patient is not sleeping well and I have written him a  prescription for Ativan 1 mg 1/2 to 1 tablet to take at bedtime as  needed.  I will touch base with Monique, his Hospice nurse, and plan on  having blood work checked on him next week.  I  will see the patient back in the next three weeks.  I suspect that the  patient's slow decline downward will continue.      Dorian Pod, ACNP  Electronically Signed      Bevelyn Buckles. Bensimhon, MD  Electronically Signed   MB/MedQ  DD: 03/22/2007  DT: 03/22/2007  Job #: [id number]

## 2010-11-18 NOTE — Assessment & Plan Note (Signed)
[facility]                          CHRONIC HEART FAILURE NOTE   NAME:[name] [name], [name] [name]                    MRN:          [id number]  DATE:11/26/2006                            DOB:          [birth year]    [name] [name] returns today for followup of his end-stage congestive  heart failure.  He is currently being maintained on a dobutamine drip  under the care of hospice.  [name] [name] returns today secondary to  increased symptoms of fatigue over the last few days and nonproductive  cough.  Previously he had been treated with a round of Avelox and then  treated after that with a round of antibiotics by Dr. Kriste Basque, per the  patient's wife's report.  The patient states that he felt better  initially after each round of antibiotics and then once again had the  nonproductive cough and increased fatigue and just feeling blah all  over.  He states, I just don't feel right.  He has mild increase in  shortness of breath also.  Denies any fevers or chills.  Appetite has  been good.  He is sleeping okay.  CBGs have been running in the 90s at  home in the mornings.  Otherwise, no problems suggestive of volume  overload.  No complaints of chest discomfort.   PAST MEDICAL HISTORY:  1. Essentially unchanged.  Congestive heart failure secondary to      nonischemic cardiomyopathy with an EF of 30, status post placement      of a BiV cardioverter defibrillator without much improvement in      symptoms.  Patient is currently maintained on a dobutamine drip in      the setting of end-stage heart failure under the care of hospice.  2. Atrial fib.  3. Chronic renal insufficiency with creatinine between 2-3, followed      by Dr. Eliott Nine.  4. Do not resuscitate status.  5. Diabetes.  6. Anticoagulation therapy with Coumadin.  7. COPD.  8. Nonobstructive CAD by cath.   REVIEW OF SYSTEMS:  As stated above.   CURRENT MEDICATIONS:  1. Digoxin 0.125 mg on Monday,  Wednesday, and Fridays.  2. Furosemide 40 mg b.i.d.  3. Hydralazine 50 t.i.d.  4. Lipitor 20.  5. Methylprednisolone 4 mg daily.  6. Isosorbide 120.  7. Coumadin as directed.  8. Calcitriol.  9. Senokot.  10.Prilosec.  11.Duonebs q.i.d.  12.Budesonide nebs b.i.d.  13.Glimepiride 4 mg daily.  14.Jenuvia 100 mg.  15.Magnesium potassium 20 mEq daily.  16.Dobutamine 2.5 mcg drip.  17.PRN meds include albuterol, Darvocet, tramadol, loratadine, and      Combivent inhaler.   PHYSICAL EXAMINATION:  VITAL SIGNS:  Weight 175.  Blood pressure 122/68  with a heart rate of 59.  GENERAL:  [name] [name] is in no acute distress.  NECK:  No jugular venous distention at 45 degree angle.  LUNGS:  He has fine crackles in his right base.  CARDIOVASCULAR:  An S1 and S2.  Positive S3.  ABDOMEN:  Soft and nontender.  Positive bowel sounds.  EXTREMITIES:  Lower extremities without clubbing, cyanosis or edema.  NEUROLOGIC:  Patient is alert and oriented x3.   Recent lab work done on May 15th, potassium 4.4, BUN 43, creatinine  stable at 2.23, magnesium 2.3.   IMPRESSION:  [name] [name] is still with complaints of nonproductive  cough and increased shortness of breath.  Will obtain chest x-ray.  The  chest x-ray reviewed by Dr. Nicholes Mango with some haziness in the  right lower lobe.  Patient scheduled for a noncontrast chest CT on  Tuesday for further evaluation.  Otherwise, will continue current  medications and follow up pending results of CT.      Dorian Pod, ACNP  Electronically Signed      Bevelyn Buckles. Bensimhon, MD  Electronically Signed   MB/MedQ  DD: 11/26/2006  DT: 11/26/2006  Job #: [id number]   cc:   [facility] Salvia. Eliott Nine, [name]

## 2010-11-18 NOTE — Assessment & Plan Note (Signed)
[facility]                                 ON-CALL NOTE   NAME:[name] [name], [name] [name]                    MRN:          [id number]  DATE:02/26/2007                            DOB:          03-10-1924    PROBLEM:  Hospice nurse, Inetta Fermo, at the phone number of [phone], called  this patient, who is followed by hospice for CHF, had been more short of  breath overnight on oxygen at 2 liters.  He increased the oxygen to 3  liters and then turned it back down in the morning.  He is complaining  increased weakness and no bowel movement in two days.  He had been on a  prednisone taper.  The question is whether he is having more trouble  with heart failure.  I suggested increasing Lasix from 40 mg twice a day  to 80 mg twice a day for the day of the call only, and then dropping it  back to 40 mg twice a day, treating the constipation with MiraLax that  he had at home and leaving the oxygen at 3 liters.   I was then called the morning of February 27, 2007 at around 6:15 A.M. by  Dr. Weldon Inches from the emergency room who stated that the patient had  fallen while reaching for something; and, he had some difficulty getting  back up.  They brought him in.   The labs were unremarkable, except that the hemoglobin had dropped from  approximately 11 to approximately 9 with several weeks between  determinations.  They found heme negative occult blood examination of  the stool and no evidence of active bleeding.  They were able to  stabilize him.   The patient wanted to go home.  He was nonambulatory under observation,  so he was sent home with instructions to contact Dr. Kriste Basque at the  beginning of the week.     Clinton D. Maple Hudson, MD, Tonny Bollman, FACP  Electronically Signed    CDY/MedQ  DD: 02/27/2007  DT: 02/28/2007  Job #: [id number]   cc:   [name], MD

## 2010-11-18 NOTE — Discharge Summary (Signed)
[name], [name] [name]             ACCOUNT NO.:  [id number]   MEDICAL RECORD NO.:  [id number]          PATIENT TYPE:  INP   LOCATION:  4710                         FACILITY:  [facility]   PHYSICIAN:  Veverly Fells. Excell Seltzer, MD  DATE OF BIRTH:  March 29, 1924   DATE OF ADMISSION:  05/30/2007  DATE OF DISCHARGE:  06/03/2007                               DISCHARGE SUMMARY   PRIMARY CARDIOLOGIST:  Bevelyn Buckles. Bensimhon, MD.   PRIMARY CARE Shari Natt:  [name], MD.   DISCHARGE DIAGNOSIS:  Peripherally inserted central catheter line  infection.   SECONDARY DIAGNOSES:  1. Acute on chronic systolic congestive heart failure on home      intravenous dobutamine.  2. Hypertension.  3. Hyperlipidemia.  4. Type 2 diabetes mellitus.  5. Chronic atrial fibrillation on chronic Coumadin anticoagulation.  6. Remote tobacco abuse.  7. Nonischemic cardiomyopathy, ejection fraction 25-30%.  8. Status post Guidant biventricular implantable cardioverter-      defibrillator November 2005.  9. Stage IV chronic kidney disease.  10.Hyperparathyroidism.  11.Gout.  12.Gastroesophageal reflux disease.  13.Chronic obstructive pulmonary disease on home oxygen.  14.Osteoarthritis.  15.History of cerebrovascular disease with 60-79% left internal      carotid artery stenosis.  16.Anemia.   ALLERGIES:  1. PENICILLIN.  2. FLAGYL.  3. BIAXIN.  4. RESTORIL.  5. AMBIEN.   PROCEDURE:  None.   HISTORY OF PRESENT ILLNESS:  An 77 year old Caucasian male with a long  history of nonischemic cardiomyopathy and chronic systolic congestive  heart failure, chronic intravenous dobutamine at home via a PICC line,  who presented to the [facility] ED May 30, 2007 with complaints of  shortness of breath, progressive dyspnea along with a 5 week history of  intermittent fevers, chills, nausea and weakness occurring following  PICC line flushes.  At one point over that period of time, he was  treated with Avelox for tracheal  bronchitis.  However, he has continued  to have recurrent fevers and malaise.  The patient was admitted for  further evaluation and management of presumed PICC line infection.   HOSPITAL COURSE:  Mr. Vanallen's PICC line was discontinued and  peripheral IV access was obtained.  He was initiated on IV vancomycin  and Avelox.  His blood cultures were negative.  However, catheter tip  culture was positive for gram negative rods.  He exhibited leukocytosis  on admission with a WBC of 18.7.  However following treatment with  antibiotics, his discharge WBC is 9.5.  He has remained afebrile  throughout his admission.   On admission, the patient was felt to exhibit mild acute on chronic  systolic heart failure.  He did receive some additional intravenous  Lasix with good diuresis and reduction in weight and volume status.   Following 72 hours of negative blood cultures, the decision was made to  replace his PICC line on June 03, 2007.  Following his chest x-ray,  provided that this shows no complications, his IV dobutamine will be  infused via the PICC line and the patient will be discharged home in  satisfactory condition.  We have arranged followup  for him in [facility]  Cardiology Heart Failure Clinic on June 10, 2007 at 2 p.m.   LABORATORY DATA:  Hemoglobin 9.0, hematocrit 27.1, WBC 9.5, platelets  317, INR 2.0, sodium 142, potassium 4.3, chloride 109, CO2 25, BUN 45,  creatinine 2.21, glucose 191, total bilirubin 0.9, alkaline phosphatase  79, AST 19, ALT less than 8, total protein 6.1, albumin 3.4, calcium  8.6, BNP 442.0, digoxin 0.4.  Urinalysis negative.  Fecal occult blood  negative.  Blood culture is negative x 2.  PICC line catheter tip showed  75 colonies of gram negative rods.   DISPOSITION:  The patient will be discharged home today in good  condition.   FOLLOW UP:  1. As noted above, the patient is to follow up with Dorian Pod in      the heart failure clinic on  June 10, 2007 at 2 p.m.  We will      obtain a CBC and BMET at that time as well.  2. He is asked to follow up with Dr. Kriste Basque as previously scheduled.   DISCHARGE MEDICATIONS:  1. Avelox 400 mg daily x 7 days.  2. Coumadin 5 mg q.h.s.  3. Lipitor 20 mg daily.  4. Imdur 120 mg daily.  5. Digoxin 0.125 mg daily.  6. Guaifenesin 1200 mg b.i.d.  7. Amaryl 4 mg daily.  8. Lasix 80 mg q.a.m. and 40 mg q.p.m.  9. Senna 2 tabs daily.  10.Pulmicort 0.25 mg inhaled b.i.d.  11.Januvia 100 mg daily.  12.K-Dur 20 mEq daily.  13.MagOx 400 mg daily.  14.Calcitriol 0.25 mg every other day.  15.Prednisone 10 mg x 1 June 04, 2007 to complete steroid taper      for COPD flare.  16.Methylprednisolone 4 mg daily.  17.Atrovent nebulizer q.i.d.  18.Albuterol nebulizer q.i.d.  19.Dobutamine infusion 2.5 mcg per kilogram per minute IV.  20.Prilosec 20 mg daily.   OUTSTANDING LABORATORY STUDIES:  None.   Duration of discharge encounter 60 minutes including physician time.      Nicolasa Ducking, ANP      Veverly Fells. Excell Seltzer, MD  Electronically Signed    CB/MEDQ  D:  06/03/2007  T:  06/03/2007  Job:  [id number]   cc:   [name], MD

## 2010-11-18 NOTE — Assessment & Plan Note (Signed)
[facility]                          CHRONIC HEART FAILURE NOTE   NAME:[name] [name], [name] [name]                    MRN:          [id number]  DATE:07/18/2007                            DOB:          11-Oct-1923    PRIMARY CARE PHYSICIAN:  [name], [name]   PRIMARY CARDIOLOGIST:  Bevelyn Buckles. Bensimhon, [name]   [name] returns today for followup of his congestive heart failure  which is end-stage.  Today, he is complaining of chills, a productive  cough of green thick sputum,  increased shortness of breath, increased  dyspnea, increased use of nebulizers, increased fatigue, periods of  confusion, and increased irritability with family.  Daughter accompanies  Mr. and Mrs. Clovis Riley today.  She is very concerned that his status has  declined over the last few weeks.  He continues to be maintained on  positive  inotropic therapy with dobutamine.  He was recently treated  for a lower extremity rash with steroids and cream, with improvement in  symptoms.   PAST MEDICAL HISTORY:  Congestive heart failure secondary to nonischemic  cardiomyopathy with EF of 30% status post placement of a biventricular  ICD without improvement in symptoms.  The patient end-stage heart  failure under the care of hospice to be maintained on dobutamine drip  with a DNR/no code blue status.   REVIEW OF SYSTEMS:  As stated above.   CURRENT MEDICATIONS:  1. Hydroxyzine 25 mg b.i.d.  2. Qvar 80 mg b.i.d.  3. Dexamethasone cream b.i.d.  4. Lorazepam 0.5 mg q.h.s.  5. Furosemide 80 in the morning and 40 in the evening.  6. Klor-Con 20 daily.  7. Methylprednisolone 4 mg daily.  8. Isosorbide 120 daily.  9. Warfarin 5 mg as directed.  10.Lantus insulin.  11.Senokot 2 tablets q.h.s.  12.Humalog sliding scale.  13.Mag-Ox 400 daily.  14.Lipitor 20 daily.  15.Digoxin 125 mcg on Monday, Wednesday, and Friday.  16.Prilosec 20 daily.  17.Albuterol or Atrovent nebs t.i.d.  18.Dobutamine 2.5 mcg drip.  19.Doxepin 25 mg b.i.d.  20.Loratadine 10 mg b.i.d.   AS NEEDED MEDICATIONS:  Combivent, tramadol, Zofran, Humalog, Mucinex  DM.   PHYSICAL EXAMINATION:  VITAL SIGNS:  Weight 171 pounds.  Blood pressure  132/62, heart rate of 92, saturating 96% on 3 liters nasal cannula.  GENERAL:  Frail, having chills.  SKIN:  Warm and dry to touch.  NECK:  JVD 8-10 cm at a 90-degree angle.  LUNGS:  He has scattered rhonchi throughout.  CARDIOVASCULAR:  S1 and S2.  Regular rate and rhythm.  2/6 systolic  ejection murmur.  ABDOMEN:  Soft, nontender, positive bowel sounds.  EXTREMITIES:  Lower extremities with signs of chronic venous  insufficiency changes with only a trace of edema at this time.   IMPRESSION:  End-stage congestive heart failure, now with fever, chills,  productive cough.  Will treat with Avelox.  Patient also with increased  agitation.  Will increase Ativan to 0.5 mg twice daily and start  morphine nebulizers every 2 hours as needed at home for symptomatic  relief.  Family has spoken with Dr. Kriste Basque about cutting  some of his  medicines from his list.  I believe this would be quite appropriate.  I  am going to go ahead today in discontinue his digoxin, Lipitor,  magnesium, and Prilosec.  Will touch base with Monique, the patient's  hospice nurse, this afternoon and review changes with her.      Dorian Pod, ACNP  Electronically Signed      Bevelyn Buckles. Bensimhon, MD  Electronically Signed   MB/MedQ  DD: 07/18/2007  DT: 07/18/2007  Job #: [id number]

## 2010-11-18 NOTE — Assessment & Plan Note (Signed)
[facility]                          CHRONIC HEART FAILURE NOTE   NAME:[name] [name] SALGUERO                    MRN:          [id number]  DATE:03/01/2007                            DOB:          1923-09-07    [name] [name] returns today for followup as a work-in patient.  He,  apparently, was seen in the emergency room a few nights ago secondary to  a fall.  The patient was in bed, had woken up, and was going to go to  the bathroom.  He sat up on the side on the bed and reached for his  bottle of water he keeps on the bedside table and it fell and hit the  floor.  The patient tried to catch it before it fell, and he slid out of  the bed and landed on the floor.  However, he also had noted increased  shortness of breath and increased weakness.  The patient was taken to  the emergency room for further evaluation and chest x-ray was done.  It  was noted at that time that patient's peripherally inserted central  catheter line had changed position.  The patient's chest x-ray showed  moderate cardiomyopathy with suspicion of mild pulmonary venous  congestion with right-sided peripherally inserted central catheter line,  now extending to the right neck.  The patient had blood work obtained at  that visit.  BUN, creatinine 44 and 2.14, potassium 4.5, digoxin 0.6.  The patient was found to be anemic with an H&H 9.5 and 28.1, Hemoccult  stool for blood negative, urinalysis negative.  The patient was  discharged home with instructions to follow up here for evaluation of  his peripherally inserted central catheter line and anemia with Dr.  Kriste [name].  [name] [name] returns today stating he still has ongoing  shortness of breath and generalized weakness.  His weight has been  stable at home, denies any peripheral edema.  No problems with  dobutamine infusion via current peripherally inserted central catheter  line.  He still complains of ongoing rash which has been evaluated  by  Dr. Elliot [name] with dermatology.  The patient is currently on steroids and  topical agents that seem to be remedying the rash to some degree.  Chronic dyspnea unchanged in the setting of end-stage heart failure.   PAST MEDICAL HISTORY:  1. Includes end-stage congestive heart failure secondary to      nonischemic cardiomyopathy with an ejection fraction of 30%.  2. DO NOT RESUSCITATE, no code blue status, on home dobutamine therapy      via peripherally inserted central catheter line currently.  Under      the care of hospice.  3. Chronic renal insufficiency.  4. Chronic atrial fibrillation.  5. Severe chronic obstructive pulmonary disease.   REVIEW OF SYSTEMS:  As stated above.   CURRENT MEDICATIONS:  1. Dobutamine drip.  2. Coumadin.  3. Digoxin 0.125, Mondays, Wednesdays, and Fridays.  4. Lasix 40 mg b.i.d.  5. Imdur 120 daily.  6. Lipitor 20 daily.  7. DuoNebs q.i.d.  8. Budesonide b.i.d.  9. Oxygen 2 L p.r.n.  10.Medrol  4 mg daily.  11.Guaifenesin 2 tablets b.i.d.  12.Prilosec 20 mg daily.  13.Senokot 2 tablets nightly.  14.Calcitriol 0.25 mg every other day.   Note:  Hydralazine and allopurinol have been discontinued secondary to  rash.   PHYSICAL EXAMINATION:  Weight 170, blood pressure 124/61, heart rate 62.  JVD at 10 cm, 45 degree angle.  The patient had crackles in bilateral upper lobes.  S1, S2, irregular heart rhythm.  ABDOMEN:  Soft, nontender, positive bowel sounds.  LOWER EXTREMITIES:  Without cyanosis, clubbing, or edema.  SKIN:  Warm and dry.  Ecchymotic areas with diffuse maculopapular rash  on his trunk and extremities.   IMPRESSION:  Congestive heart failure, stable.  Continue home dobutamine  via peripherally inserted central catheter line.  Will send patient to  [facility] radiology department for reposition of peripherally inserted  central catheter line.  May replace peripherally inserted central  catheter line if needed.  Will check lab work  today.  I am going to go  ahead and order an anemia panel with his BMET and BNP.  The patient is  to follow up with Dr. Kriste [name] for further evaluation of anemia.      [name] [name], ACNP  Electronically Signed      [name] Buckles. Bensimhon, MD  Electronically Signed   MB/MedQ  DD: 03/01/2007  DT: 03/02/2007  Job #: [id number]   cc:   [name] [name]. [name] [name], [name]

## 2010-11-18 NOTE — Assessment & Plan Note (Signed)
[facility]                             PULMONARY OFFICE NOTE   NAME:[name] [name], [name] [name]                    MRN:          [id number]  DATE:03/29/2007                            DOB:          07-06-1924    HISTORY OF PRESENT ILLNESS:  The patient is a very complicated male  patient of Dr. Jodelle Green who has a history of congestive heart failure on  chronic IV dobutamine, coronary artery disease, diabetes mellitus,  hypertension, hyperlipidemia, cardiomyopathy status post biventricular  ICD in 2005, and chronic kidney disease stage IV, and atrial  fibrillation on chronic Coumadin therapy.  The patient presents today  for an acute office visit.  The patient complains of left ear pain over  the last several days.  The patient complains that he is unable to lie  on his left side due to the ear pain.  He denies any drainage, injury,  fever, or upper respiratory symptoms.  The patient denies any chest pain  or shortness of breath.  The patient is accompanied by a family member  who complains that the patient has a chronic dermatitis that he has been  seen by dermatology before.  Uses creams and hydroxyzine and recently  was prescribed some Xanax due to persistent pruritus and difficulty with  sleep.  The patient has had increased weakness and has become more  unsteady since using these medications.  The patient's daughter reports  that he has in the past had a severe sulfa allergy and is concerned that  he has been on Amaryl, which is in the sulfan aurea class of  medications, and is concerned that this could be causing his persistent  dermatitis.  The patient is insulin dependent now and uses regular  insulin at mealtime.   PAST MEDICAL HISTORY:  Reviewed.   CURRENT MEDICATIONS:  Reviewed.   PHYSICAL EXAM:  The patient is a chronically ill-appearing male in no  acute distress.  He is afebrile with stable vital signs.  O2 saturation  is 95% on 2L.  HEENT:   Left EAC shows a total occlusion of the ear canal with a hard  yellowish wax.  Right EAC is clear.  NECK:  Supple.  LUNGS:  Sounds are diminished in the bases.  ABDOMEN:  Soft.  EXTREMITIES:  Warm without any calf tenderness, cyanosis, clubbing.  There is trace edema bilaterally.  SKIN:  Without any notable rash or abnormal lesions.   IMPRESSION AND PLAN:  1. Left cerumen impaction.  Ear irrigation and wax extraction was      completed in which the patient appeared to tolerate without any      difficulty.  Was unable to remove all of the wax.  Have recommended      that the patient use peroxide and/or Debrox to help soften and to      further the removal.  If this does not completely clear at home,      may return and I will attempt to irrigate once again.  The patient      felt much improved after irrigation, reporting that his pain had  resolved and hearing was improved as well.  2. Chronic pruritus with a questionable underlying dermatitis.  The      patient does have multiple medical problems, including chronic      kidney disease, hyperparathyroidism, and is on multiple      medications.  The patient has been using Atarax and recently given      some Xanax.  The patient has severe debilitation with failure to      thrive and unsteadiness on his feet.  Suspect these medicines are      increasing his symptoms of unsteadiness.  Have recommended that he      stop his Atarax and hold his Xanax for now, and use claritin or      Zyrtec at bedtime to help with itching.  At present, I have low      suspicion that Amaryl is contributing to this.  However, will hold      Amaryl for 2 weeks, and have adjusted his regular sliding scale      insulin to help with his blood sugars.  The patient may continue on      his Januvia as prescribed for diabetes.  The patient will return      back here in 2 weeks or sooner if needed.  3. Diabetes mellitus.  In-depth discussion with the patient and  family      that the patient will continue on Januvia.  Adjust regular insulin      for blood sugars 150 to 200 with 2 units and 200 to 250 with 4      units of regular insulin at mealtime 3 times a day.  If sugars are      greater than 250, is to call the office.  The patient will return      back here in 2 weeks or sooner if needed.      Rubye Oaks, NP  Electronically Signed      [name], MD  Electronically Signed   TP/MedQ  DD: 03/29/2007  DT: 03/30/2007  Job #: [id number]

## 2010-11-18 NOTE — Assessment & Plan Note (Signed)
[facility]                          CHRONIC HEART FAILURE NOTE   NAME:[name] [name], [name] [name]                    MRN:          [id number]  DATE:06/10/2007                            DOB:          1923-11-24    PRIMARY CARE PHYSICIAN:  [name] [name].   PRIMARY CARDIOLOGIST:  [name] [name].   [name] [name] returns today for followup of his congestive heart failure,  which is endstage class IV New York Heart Association.  He was to  continue his dobutamine therapy under the care of hospice.  [name] [name]  was just discharged home from [facility] where he was admitted,  and found to have bacteria growth at the tip of his PIC line.  PIC line  was discontinued.  Patient was treated with the appropriate antibiotics,  and discharged home after placement of a new PIC line was inserted in  his left arm.  Previous site had been in the right arm.  [name] [name] is  accompanied by his wife, [name] [name], and their daughter today.  Family is  concerned that [name] [name] has been retaining some fluid since he was  discharged to home.  He complains of decreased energy level, which is  chronic for him.  Appetite has been good.  Denies any fevers or chills  since being discharged home.  No problems with PIC site.  He has noted  increased shortness of breath with minimal exertion, and some swelling  in his lower extremities with mild orthopnea.   PAST MEDICAL HISTORY:  1. Congestive heart failure secondary to nonischemic cardiomyopathy      with an EF of 30%, status post placement of a biventricular ICD      without improvement in symptoms.  Patient is currently being      maintained on dobutamine drip in the setting of endstage heart      failure under the care hospice.  2. Chronic atrial fibrillation.  3. Chronic renal insufficiency followed by [name] [name].  4. Chronic anticoagulation therapy.  5. DNR code blue status.  6. Diabetes.  7. COPD requiring  nebulizer treatments q.i.d.  8. Nonobstructive CAD by catheterization.  9. Ongoing dermatological rash severely pruritic in nature with      unclear etiology.   REVIEW OF SYSTEMS:  As stated above.   CURRENT MEDICATIONS:  Include:  1. Digoxin 125 mcg on Monday, Wednesday and Friday.  2. Furosemide 80 mg in the a.m. and 40 in the p.m.  3. Klor-Con 20 mEq daily.  4. Lipitor 20 mg daily.  5. Methylprednisone 4 mg daily.  6. Isosorbide 120 daily.  7. Warfarin per [facility].  8. Calcitriol 0.25 mg every other day.  9. Senokot 2 tablets at bedtime.  10.Guaifenex 2 tablets b.i.d.  11.Magnesium oxide 400 mg daily.  12.Prilosec 20 mg daily.  13.Albuterol and Atrovent (Duo-Neb q.i.d.).  14.Budesonide nebulizer 0.25 mg q.i.d.  15.Dobutamine 2.5 mcg IV via PIC line.  16.Januvia 100 mg daily.  17.Amaryl is currently on hold.   His p.r.n. medications include Combivent, loratadine, doxapram,  tramadol, Senokot, Zofran, insulin.   PHYSICAL EXAMINATION:  Weight 172 pounds.  Weight is up 3 pounds from  November.  Blood pressure 123/57 with a heart rate of 80.  In no acute distress.  JVD 8 cm to 10 cm at a 45-degree angle.  LUNGS:  Clear to auscultation.  CARDIOVASCULAR EXAM:  Reveals an S1 and S2.  Positive S3.  ABDOMEN:  Soft and nontender.  Positive bowel sounds.  LOWER EXTREMITIES:  With pitting edema bilaterally, left being greater  than right.  NEUROLOGIC:  Alert and oriented x3.  Ambulates with assistance of  rolling walker.   IMPRESSION:  1. Congestive heart failure with signs of volume overload at this      time.  Will continue Lasix at current dose, and I just increased      him to 80 mg last month.  I will treat him with Zaroxolyn 2.5 mg x1      dose today, and then 1 in the a.m. prior to his Lasix, and then      resume his Lasix at 80 mg in the a.m., and 40 in the evening.  2. See patient back next week for reevaluation.      Dorian Pod, ACNP  Electronically  Signed      Rollene Rotunda, MD, [facility]  Electronically Signed   MB/MedQ  DD: 06/14/2007  DT: 06/15/2007  Job #: [id number]

## 2010-11-18 NOTE — Assessment & Plan Note (Signed)
[facility]                             PULMONARY OFFICE NOTE   NAME:[name] [name], [name] [name]                    MRN:          [id number]  DATE:05/23/2007                            DOB:          05-Aug-1923    HISTORY OF PRESENT ILLNESS:  The patient is a very complicated 77-year-  old white male patient of Dr. Jodelle Green who has a known history of  congestive heart failure, on chronic IV dobutamine, coronary artery  disease, diabetes mellitus, hypertension, hyperlipidemia, and  cardiomyopathy status post biventricular ICD in 2005 and chronic kidney  disease stage 4 and atrial fibrillation on chronic Coumadin therapy. The  patient is on hospice therapy. The patient presents today for an acute  office visit. The patient complains that over the last week he has had  increased cough, congestion with thick green sputum, fever, chills, T-  MAX of 102. The patient was started on Avelox which he is on day 5 of 7.  Fevers have subsided. However, he continues to have fatigue and general  malaise with productive cough. The patient denies any hemoptysis,  orthopnea, PND or leg swelling. The patient also has been having  difficulty with elevated blood sugars over the last several weeks  despite an increase sliding scale insulin dose with meals.   PAST MEDICAL HISTORY:  Reviewed.   CURRENT MEDICATIONS:  Reviewed.   PHYSICAL EXAMINATION:  The patient is pleasant elderly male in no acute  distress. He is afebrile with stable vital signs. O2 saturation is 96%  on 3 liters.  HEENT: Unremarkable.  NECK: Supple without cervical adenopathy. No JVD.  LUNGS: Lung sounds reveal diminished breath sounds in the bases without  any wheezing or crackles.  CARDIAC: Regular rate. Grade 1/6 systolic murmur.  ABDOMEN: Soft, nontender.  PICC line along the right upper forearm site appears clean. No drainage  or redness noted. Pulses are intact.  EXTREMITIES: Lower extremities reveal 1+  edema bilaterally. No calf  tenderness.   IMPRESSION/PLAN:  1. Slow to resolve tracheobronchitis. The patient is to extend Avelox      out for an addition three days for a total of ten days of therapy.      Add in Mucinex DM twice daily and use Tylenol as needed for fever      or body aches. The patient will return back in one week or sooner      if needed.  2. Diabetes mellitus with recent hyperglycemia. I have recommended      that we increase his sliding scale insulin up by 2 units at      mealtime. On return, if the patient is still having elevated blood      sugars, will add in Lantus insulin. To note, the patient is on a      very complicated medication list. I have tried to simplify this as      much as possible. However, we      may have to add in an additional insulin therapy to keep him better      controlled with his blood sugars.  Rubye Oaks, NP  Electronically Signed      [name], MD  Electronically Signed   TP/MedQ  DD: 05/24/2007  DT: 05/24/2007  Job #: [id number]

## 2010-11-18 NOTE — Assessment & Plan Note (Signed)
[facility]                         ELECTROPHYSIOLOGY OFFICE NOTE   NAME:Cherubini, KHAYRI KARGBO                    MRN:          [id number]  DATE:03/17/2007                            DOB:          03/12/1924    Mr. Harmes is seen today in followup for a defibrillator implanted for  nonischemic congestive heart failure which is now end-stage as he is on  home dobutamine.  He has inter-currently been made a do not resuscitate  and his wife and I had a lengthy discussion this morning regarding what  to do in this regard.  We have decided to inactivate tachy therapy for  his defibrillator.  The patient is unfortunately unable to participate  in these discussions.   He does say that his breathing is fine and he has not had any chest  pain.   His current medications include his dobutamine, Coumadin, Lanoxin,  Lasix, Imdur, Lipitor, budesonide, oxygen.   EXAMINATION TODAY:  VITAL SIGNS:  His blood pressure was 100/59, his  pulse was 77.  LUNGS:  Clear.  HEART:  Regular.  EXTREMITIES:  Without edema.   He will follow up with the [facility].  I will plan to see him  again only as needed and his defibrillator was inactivated as noted.     Duke Salvia, MD, [facility]  Electronically Signed    SCK/MedQ  DD: 03/17/2007  DT: 03/17/2007  Job #: [id number]

## 2010-11-18 NOTE — Assessment & Plan Note (Signed)
[facility]                          CHRONIC HEART FAILURE NOTE   NAME:[name] [name], [name] [name]                    MRN:          [id number]  DATE:02/02/2007                            DOB:          1924-04-27    [name] returns today for followup of his congestive heart failure  which is secondary to nonischemic cardiomyopathy requiring positive  inotropic therapy in the setting of end-stage disease.  [name] was  recently discharged home from [facility] where he was admitted  for an infected, right Port-A-Cath.  Cultures grew Pseudomonas sensitive  to Cipro.  The patient was cared for by Dr. Alroy Dust.  Port-A-Cath  was removed by interventional radiology with plans for antibiotic  therapy.  Wound management by hospice health care.  The patient  currently has a PICC line in for dobutamine therapy and states he just  completed his round of antibiotics yesterday.  He has done well since he  was discharged home.  He complains of generalized weakness most likely  secondary to deconditioning and length of hospital stay.  His weight has  been stable with no new symptoms suggestive of volume overload.  Mr.  [name] [name] is accompanied by his wife, [place], who states he  has been doing well since he was discharged home.  No new problems.   PAST MEDICAL HISTORY:  1. Congestive heart failure secondary to nonischemic cardiomyopathy      with an EF of 30%, status post placement of biventricular ICD      without much improvement in symptoms.  The patient currently      maintained on dobutamine drip in the setting of end-stage heart      failure under the care of hospice.  2. Chronic atrial fibrillation.  3. Chronic renal insufficiency followed by Dr. Eliott Nine.  4. Do not resuscitate status.  5. Diabetes.  6. Intact coagulation therapy with Coumadin.  7. COPD.  8. Nonobstructive coronary artery disease by catheterization.  9. Status post  recent explantation of a Port-A-Cath to the right chest      secondary to a Pseudomonas infection.   REVIEW OF SYSTEMS:  As stated above.   CURRENT MEDICATIONS:  1. IV dobutamine therapy.  2. Coumadin, per Coumadin clinic.  3. Digoxin 0.125 mg on Monday, Wednesday and Friday.  4. Lasix 40 mg b.i.d.  5. Hydralazine 50 mg t.i.d.  6. Imdur 120 mg daily.  7. Lipitor 20 mg daily.  8. DuoNeb four times a day.  9. Budesonide nebulizers b.i.d.  10.O2 at 2 L nasal cannula.  11.Medrol 4 mg daily.  12.Guaifenesin two tablets b.i.d.  13.Prilosec 20 mg daily.  14.Senokot two tablets nightly.  15.Allopurinol 100 mg daily.  16.Calcitriol 0.25 mg every other day.  17.Magnesium 400 mg daily.  18.KCl 20 mEq daily.  19.Januvia 100 mg half tablet daily.  20.Glipizide 4 mg daily.   PHYSICAL EXAMINATION:  VITAL SIGNS:  Weight 174 pounds, blood pressure  127/61 with a heart rate initially recorded at 42 with apical pulse of  62.  GENERAL:  [name] [name] is in no acute distress.  NECK:  Jugular vein distention at 8 cm at a 45-degree angle.  LUNGS:  Clear to auscultation bilaterally.  CHEST:  Previous Port-A-Cath site to the right chest.  Incision is  opened.  No pocketing noted without redness.  Small amount of pale  yellow discoloration on gauze.  Wife states the patient had Hydrogel  applied yesterday by the hospice nurse.  Discoloration does not appear  to be purulent and does not have an odor.  CARDIAC:  S1, S2, positive S3.  Irregularly, irregular.  ABDOMEN:  Soft, nontender, positive bowel sounds.  EXTREMITIES:  Lower extremities without clubbing or cyanosis.  He has  just a trace of edema that his nonpitting bilaterally.  NEUROLOGIC:  He is alert and oriented x3, ambulating with assistance of  his walker.   IMPRESSION:  End-stage heart failure without signs of significant volume  overload at this time.   PLAN:  I am going to have Mr. Cundari take an additional 40 mg of his  Lasix when  he returns home today.  Will check lab work including a Nutritional therapist  and BNP today.  Continue to have dressing changes done daily by hospice  nurse.  Have the patient follow up in a few weeks with Dr. Gala Romney for  re-evaluation of his previous Port-A-Cath site.  Hopefully, we will  tentatively plan on replacing Port-A-Cath and discontinuing PICC line in  near future if Port-A-Cath site continues to heal.  We appreciate Dr.  Alroy Dust taking care of patient during recent hospitalization  concerning infected Port-A-Cath site.     Dorian Pod, ACNP  Electronically Signed      Bevelyn Buckles. Bensimhon, MD  Electronically Signed   MB/MedQ  DD: 02/02/2007  DT: 02/03/2007  Job #: [id number]

## 2010-11-18 NOTE — Assessment & Plan Note (Signed)
[facility]                          CHRONIC HEART FAILURE NOTE   NAME:[name] [name], [name] [name]                    MRN:          [id number]  DATE:05/20/2007                            DOB:          1923/09/07    PRIMARY CARE PHYSICIAN:  Alroy Dust.   PRIMARY CARDIOLOGIST:  Dr. Nicholes Mango.   [name] [name] returns today for followup of his congestive heart failure  which is end-stage class IV New York Heart Association, with continuous  dobutamine therapy under the care of hospice.  [name] [name] has been  stable since I last saw him in October.  No change in ongoing pleuritic  rash.  He has complained of some chills, episode of vomiting with fever  yesterday and was started on Avelox therapy.  Hospice nurse had called  Dr. Jodelle Green office to obtain an order.  [name] [name] CBGs are also  labile, running from 180 to 348.  He is on sliding scale insulin  coverage at this time.  PICC site to right upper arm without complaints  of discomfort.  Well-healed previous Port-A-Cath site to right chest.   PAST MEDICAL HISTORY:  1. Congestive heart failure secondary to nonischemic cardiomyopathy      with an ejection fraction of 30%, status post placement of a      biventricular ICD without improvement in symptoms.  The patient is      currently being maintained on dobutamine drip in the setting of end-      stage heart failure under the care of hospice.  2. Chronic atrial fibrillation.  3. Chronic renal insufficiency, followed by Dr. Eliott Nine.  4. Chronic anticoagulation therapy.  5. DNR, no code blue status.  6. Diabetes.  7. Chronic obstructive pulmonary disease.  8. Nonobstructive coronary artery disease by cath.  9. Ongoing dermatological rash, severely pruritic in nature.   REVIEW OF SYSTEMS:  As stated above.   CURRENT MEDICATIONS:  1. Digoxin 0.125 mg on Monday, Wednesday, Friday.  2. Warfarin per Coumadin clinic.  3. Furosemide 80 in the a.m. 40 in  the evening.  4. Lipitor 20.  5. Isosorbide 120.  6. Calcitriol 0.25 every other day.  7. Guaifenex DM 2 tablets b.i.d.  8. Amaryl 4 mg daily.  9. Prilosec 20 daily.  10.Dobutamine 2.5 mcg drip via PICC line.  11.Senokot 2 tablets daily.  12.Albuterol nebulizer q.i.d.  13.Atrovent nebulizer q.i.d.  14.Budesonide nebs b.i.d.  15.Januvia 100 mg daily.  16.Methylprednisone 4 mg daily.  17.Klor-Con 20 mEq daily.  18.Avelox 1 tablet daily.  19.Magnesium 400 mg daily.   PHYSICAL EXAMINATION:  Weight 169, the weight is up 8 pounds from  October.  Blood pressure 106/64 with a heart rate of 74.  [name] [name] is in no acute distress.  He is ambulating with assistance  of a rolling walker.  He has JVD 8 to 10 cm at a 45 degree angle.  LUNGS:  Clear to auscultation.  CARDIOVASCULAR EXAM:  Reveals an S1 and S2, positive S3.  ABDOMEN:  Soft, nontender.  Lower extremities with +1 pitting edema with left being greater than  right.  SKIN:  Warm and dry with ongoing red rash generalized to trunk, upper  extremities.   IMPRESSION:  Congestive heart failure with signs of mild volume overload  at this time.  Will increase furosemide to 80 mg b.i.d. x2 days then  resume 80 mg a.m. and 40 mg in the evening.  See patient back in 3 weeks  for reevaluation, sooner if he develops any worsening symptoms.      Dorian Pod, ACNP  Electronically Signed      Rollene Rotunda, MD, [facility]  Electronically Signed   MB/MedQ  DD: 05/20/2007  DT: 05/21/2007  Job #: [id number]

## 2010-11-18 NOTE — Discharge Summary (Signed)
[name], [name] [name]             ACCOUNT NO.:  [id number]   MEDICAL RECORD NO.:  [id number]          PATIENT TYPE:  INP   LOCATION:  4522                         FACILITY:  [facility]   PHYSICIAN:  [name], MD     DATE OF BIRTH:  1923-08-15   DATE OF ADMISSION:  08/07/2007  DATE OF DISCHARGE:  08/18/2007                               DISCHARGE SUMMARY   FINAL DIAGNOSES:  1. Admitted August 07, 2007 with left lower lobe infiltrate      compatible with pneumonia.  No organism identified, the patient      responded to broad-spectrum antibiotics with Avelox.  2. Underlying severe chronic obstructive pulmonary disease.  3. Coronary artery disease and end-stage cardiomyopathy on dobutamine      infusion.  4. History of hypertension.  5. Chronic atrial fibrillation.  6. Known cerebrovascular disease.  7. Hyperlipidemia.  8. Diabetes mellitus.  9. Gastroesophageal reflux disease.  10.Diverticulosis.  11.Abdominal distention with ileus - the patient had gastrointestinal      consultation Dr. Lina Sar this admission.  12.Renal insufficiency with creatinine in the 2.5 range.  13.History of prostate cancer.  14.Degenerative arthritis.  15.History of gout.  16.Anemia of chronic disease.  17.Skin rash and pruritus.   BRIEF HISTORY/PHYSICAL:  The patient is an 77 year old gentleman well  known to [place] with multiple medical problems as noted above.  He presented  to the emergency room on August 07, 2007 with increasing cough and  chest congestion.  He had increased dyspnea and had been unresponsive to  outpatient antibiotic treatment.  He became progressively weak, more  short of breath and was brought to emergency room with these symptoms.  Evaluated by the ER doctor and found to have a left lower lobe  infiltrate and referred for admission.   PAST MEDICAL HISTORY:  As noted above, the patient has severe cardiac  history with a nonischemic cardiomyopathy, ejection fraction around  30%.  He has a biventricular pacemaker device and is on a constant dobutamine  infusion via PICC line.  He is followed regularly in the [facility] Clinic and  attended by Dr. Antoine Poche and Dr. Gala Romney.  He is known to have  nonobstructive coronary disease, chronic atrial fibrillation and is on  Coumadin.  History of hypertension and cerebrovascular disease with a 60-  79% left internal carotid stenosis by Doppler.  He has underlying COPD  and is an ex-smoker.  He is on home oxygen and nebulizer treatments.  He  is markedly debilitated as noted.  He has a history of chronic renal  insufficiency with creatinine in the 2.5 range and is followed Dr.  Eliott Nine for nephrology.  He has known renal artery stenosis as well.  He  has had a history of prostate cancer with surgery in the 1990s.  He has  diabetes mellitus and is on insulin.  Currently taking Lantus 16 units a  day and sliding-scale Humalog.   PHYSICAL EXAMINATION:  GENERAL:  Physical examination at the time of  admission revealed an elderly, frail gentleman in no acute distress.  VITAL SIGNS:  Blood pressure 120/60, pulse  98 and irregular,  respirations 22 per minute and not labored, O2 sat 95% on oxygen 2  liters, temperature 98.4.  HEENT:  Unremarkable.  NECK:  Jugular distention at 30 degrees, no bruits detected.  CHEST:  Revealed decrease in breath sounds and a few crackles at the  left base.  No signs of consolidation.  CARDIAC:  Revealed irregular rhythm, grade 2/6 systolic ejection murmur  in the left sternal border.  No rubs or gallops heard.  ABDOMEN:  Slightly distended with increased bowel sounds.  No  organomegaly or masses.  EXTREMITIES:  Showed venous insufficiency and trace edema.  He has  evidence of degenerative arthritis and previous left total knee  replacement.  NEUROLOGIC:  Intact without focal abnormalities detected.   DIAGNOSTICS:  1. Chest x-ray showed cardiomegaly, defibrillator/pacemaker on the      left, COPD  and bibasilar (left greater than right) infiltrate with      a small effusion.  Serial films showed slow improvement in the air      space disease at the bases, small effusion remaining on the left.  2. Abdominal films showed increased bowel gas and question of an ileus      without obstructive pattern.  There were clips in the pelvis and      previous left total hip replacement.   LABORATORY DATA:  CBC showed hemoglobin 10.7, hematocrit 33.8, white  count 22,900 with a left shift.  Pro-time 26.3, INR 2.3, sodium 138,  potassium 4.6, chloride 105, CO2 27, BUN 48, creatinine 2.8, blood sugar  127, BNP was 698.  Liver enzymes within normal limits.  Albumin 2.4,  total protein 6.4, calcium 8.2.  CPK was slightly elevated with negative  MB and negative troponin.   HOSPITAL COURSE:  The patient was admitted by Dr. Shelle Iron in my absence  and started on Primaxin and Avelox for maximum coverage.  As he improved  and cultures were negative, the Primaxin was discontinued and he was  given a full course of Avelox intravenously and then orally.  His chest  x-ray showed slow, but steady improvement.  The right basilar infiltrate  resolved. The left basilar infiltrate improved.  He had a small left  effusion.  This will be followed up in the office.   He had moderate amount of difficulty from abdominal distention and  probable ileus.  GI was consulted.  He was seen by Dr. Lina Sar.  They attempted NG tube drainage with only a small amount of relief and  the patient pulled out the tube.  From there on, we managed the patient  with laxative in the form of MiraLax, Senokot, Dulcolax suppositories as  needed and anti-gas medication with Mylicon.  He was able to eat  satisfactorily, was passing gas and stool without difficulty.   From a cardiac standpoint, he was seen by Dr. Tomma Lightning. al. because  of his severe nonischemic cardiomyopathy.  His dobutamine drip was  switched from his pump to the  traditional bedside IV drip during his  hospital stay.  He remained stable from a cardiac standpoint.  His  diuretics were adjusted this hospitalization based on his renal  function.  Creatinine had risen to 3.3.  His diuretics were decreased  and he improved to the 2.3 range.  His dobutamine infusion is at 2.5  mcg/kg per minute and he was reattached to his pump prior to discharge.   The patient was seen by the care management team and [facility].  He was also seen by physical therapy, occupational therapy  and respiratory therapy during his hospital stay.  There was quite a bit  of discussion regarding possible transfer to a nursing home for rehab  and respite care for the wife.  He was given extra time in the hospital  and physical therapy felt he was safe for discharge home on August 18, 2007.  Hospital bed was arranged and additional DME equipment as needed  per Hospice.   DISCHARGE MEDICATIONS:  1. Medrol 4 mg tablets 2 tablets p.o. q.a.m.  2. Mucinex 600 mg tablets 2 tablets p.o. b.i.d. with plenty of water.  3. Nebulizer treatments with DuoNeb q.i.d. and Pulmicort b.i.d.  4. Coumadin 5 mg tablets same as before with follow up in the Coumadin      Clinic.  5. Isosorbide 120 mg p.o. q.a.m.  6. Lanoxin 0.125 mg daily on Monday, Wednesday and Friday only.  7. Lasix 80 mg p.o. q.a.m.  8. KCL 20 mEq p.o. daily.  9. Lantus insulin 16 units subcu. daily.  10.Humalog insulin 3 times a day prior to meals on sliding scale as      before.  11.Januvia 100 mg p.o. daily.  12.Omeprazole 20 mg p.o. daily.  13.Lorazepam 1 mg tablets one-half tablet p.o. t.i.d. for nerves.  14.Doxepin 25 mg p.o. b.i.d. for itching.  15.Claritin 10 mg p.o. daily for itching.  16.Hydroxyzine 25 mg p.o. q.i.d. as needed for itching.  17.MiraLax 17 gm in water b.i.d.  18.Senokot S 2 tablets p.o. q.h.s.  19.Dulcolax suppository as needed for constipation.  20.Mylanta Gas 1 tablet p.o. q.i.d.   21.Tramadol 50 mg p.o. q.i.d. as needed for pain.  22.Zofran 4 mg 1/2 to 1 tablet every 4 hours as needed for nausea.  23.Dobutamine infusion by pump as before.   CONDITION ON DISCHARGE:  Improved.   DISPOSITION:  Patient being discharged home to continue care with  [facility].      [name], MD  Electronically Signed     SMN/MEDQ  D:  08/18/2007  T:  08/19/2007  Job:  [id number]   cc:   [name], MD

## 2010-11-18 NOTE — Assessment & Plan Note (Signed)
HEALTHCARE                            CARDIOLOGY OFFICE NOTE   NAME:[name] [name], [name] [name]                    MRN:          [id number]  DATE:02/24/2007                            DOB:          [birth year]    INTERVAL HISTORY:  [name] [name] is a very pleasant 77 year old male with  multiple medical problems including end-stage congestive heart failure  secondary to nonischemic cardiomyopathy with an ejection fraction of  about 30%.  [name] [name] is on home dobutamine.  [name] [name] also has chronic renal  insufficiency, chronic atrial fibrillation and severe COPD.  [name] [name] returns  today for routine followup.   [name] [name] was recently in the hospital with an infection of his Port-A-Cath,  which was removed, and [name] [name] was treated with IV antibiotics.  [name] [name] saw the  dermatologist yesterday for a continued pruritus and rash which actually  have been going on now for almost 2 months.  [name] [name] was started on steroids.  From a cardiac standpoint, [name] [name] is stable.  [name] [name] denies any chest pain.  [name] [name]  has chronic dyspnea, which is unchanged, no lower extremity edema.   CURRENT MEDICATIONS:  1. Dobutamine.  2. Coumadin.  3. Digoxin 0.125 Monday/Wednesday/Friday.  4. Lasix 40 b.i.d.  5. Hydralazine 50 t.i.d.  6. Imdur 120 a day.  7. Lipitor 20 a day.  8. DuoNebs.  9. Home oxygen.  10.Budesonide.  11.Medrol 4 mg a day.  12.Prilosec 20 a day.  13.Senokot.  14.Calcitriol.   PHYSICAL EXAMINATION:  [name] [name] is an elderly male in no acute distress.  [name] [name]  ambulates around the clinic without any respiratory difficulty.  Blood pressure is 142/75.  His heart rate is 64.  Weight is 172, which  is stable.  HEENT:  Normal.  NECK:  Supple.  No JVD.  Carotids are 1+ bilaterally without bruits.  CARDIAC:  PMI is laterally displaced.  [name] [name] has distant heart sounds.  [name] [name]  is regular with no S3 appreciated.  LUNGS:  Diminished breath sounds throughout with some mild rhonchi.  No  wheezing.  ABDOMEN:  Soft, nontender and  non-distended.  No hepatosplenomegaly.  No  bruits.  No masses.  EXTREMITIES:  Warm with no cyanosis, clubbing or edema.  [name] [name] has a diffuse maculopapular rash on his trunk and extremities.  NEUROLOGIC:  [name] [name] is alert and oriented x3.  Cranial nerves II-XII are  intact.  [name] [name] moves all 4 extremities without difficulty.  Affect is  pleasant.   ASSESSMENT AND PLAN:  1. Congestive heart failure.  [name] [name] is stable.  We will continue home      dobutamine.  [name] [name] has not been on beta blocker due to his low output.  2. Atrial fibrillation.  This is stable.  [name] [name] has essentially chronic      atrial fibrillation, but is rate-controlled.  3. Rash.  This certainly looks like a drug rash.  Unfortunately, we do      not know which one.  [name] [name] has not started any new medications and [name] [name]      has also been very good about investigating other environmental      sources without  any clear culprit.  At this point, I suspect that      the hydralazine may be the most likely problem, so we will go ahead      and discontinue this despite his hypertension.  We will follow it      closely.  I will see him back in 1 week for routine followup.  [name] [name]      will continue his prednisone.     Bevelyn Buckles. Bensimhon, MD  Electronically Signed    DRB/MedQ  DD: 02/24/2007  DT: 02/25/2007  Job #: [id number]

## 2010-11-18 NOTE — Assessment & Plan Note (Signed)
[name] [name]                          CHRONIC HEART FAILURE NOTE   NAME:[name], [name] [name]                    MRN:          [id number]  DATE:04/14/2007                            DOB:          January 28, 1924    [name] [name] returns today for followup of his congestive heart failure  which is end stage class IV New York Heart Association with continuous  dobutamine therapy under the care of hospice.  [name] [name] has been  relatively stable since I last saw him a few weeks ago.  At that visit I  was very concerned.  He was very fatigued and weak and feeling very  poorly.  He has had an ongoing generalized rash that is excruciatingly  pruritic in nature, with no obvious cause.  Adjustments have been made  in medications.  He has been seen by dermatology.  He currently is on a  steroid with some improvement in the rash.  He also has been treated by  Dr. Jodelle [name] office for an earache.  [name] [name] states he saw Dr.  Eliott [name] yesterday, and blood work was obtained.  He previously had a Port-  a-Cath intact to his right chest; however, he had become infected  earlier this year, it had to be removed and a PICC line was inserted in  his right upper extremity for continuous infusion of his inotropic  therapy.  Port-a-Cath site appears to be well healed now; however, have  planned on continuing infusion through PICC line in the setting of rash  of unclear etiology, ongoing steroid medicating, and elevated glucose  levels.   PAST MEDICAL HISTORY:  1. Congestive heart failure secondary to nonischemic cardiomyopathy      with an ejection fraction of 30%, status post placement of a      biventricular ICD without much improvement in symptoms, currently      being maintained on dobutamine drip in the setting of end-stage      heart failure under the care of hospice.  2. Chronic atrial fibrillation.  3. Chronic renal insufficiency, followed by Dr. Eliott [name].  4. Chronic  anticoagulation therapy.  5. DNR, no code blue status.  6. Diabetes.  7. Chronic obstructive pulmonary disease.  8. Nonobstructive coronary artery disease by cath.  9. Ongoing dermatological rash, severely pruritic in nature.   REVIEW OF SYSTEMS:  As stated above.   CURRENT MEDICATIONS:  Updated by Rubye Oaks on April 12, 2007.  Updated list much appreciated.  It includes:  1. Digoxin 0.125 mg Monday, Wednesday, Friday.  2. Furosemide 40 mg b.i.d.  3. Klor-Con 20 mEq daily.  4. Lipitor 20 mg daily.  5. Methylprednisone 4 mg daily.  6. Isosorbide 120 mg daily.  7. Warfarin 10.5 mg or as per Coumadin clinic.  8. Calcitriol 0.25 mg every other day.  9. Senokot 2 tablets at bedtime.  10.Guaifenex 2 tablets b.i.d.  11.Magnesium oxide 400 mg b.i.d.  12.Prilosec 20 mg daily.  13.Albuterol and Atrovent DuoNebs q.i.d.  14.Budesonide nebs 0.25 mg b.i.d.  15.Dobutamine 2.5 mcg IV continuous infusion.  16.Januvia 100 mg daily.  17.Amaryl 2 mg daily.  18.Combivent inhaler p.r.n.  19.Loratadine p.r.n.  20.Doxepin p.r.n.  21.Tramadol p.r.n.  22.Senokot p.r.n.  23.Zofran p.r.n.  24.Insulin p.r.n.   REVIEW OF SYSTEMS:  As stated above.   PHYSICAL EXAMINATION:  Weight 161, blood pressure 101/57 with a heart  rate of 87.  [name] [name] is in no acute distress.  He is his usual pleasant self.  No signs of JVD at 45 degree angle.  LUNGS:  Clear to auscultation bilaterally.  CARDIOVASCULAR EXAM:  Reveals an S1 and S2, positive S3.  ABDOMEN:  Soft, nontender.  Positive bowel sounds.  LOWER EXTREMITIES:  Without cyanosis, clubbing, or edema.  SKIN:  Warm and dry.  He has a well-healed incision to the right upper  chest, previous Port-a-Cath site, and a PICC line to the right upper  extremity.   IMPRESSION:  Heart failure without signs of volume overload at this  time.  Continue current medications and check lab work on patient this  afternoon.  We will have the hospice nurse draw a  BMET, BNP, and PT/INR.  PT/INR results should be sent to Dr. Shelby Dubin in the Coumadin clinic.  [name] [name] continues to remain relatively stable in the setting of  class IV New York Heart Association heart failure.     [name] [name], ACNP  Electronically Signed      [name] Rotunda, MD, [facility]  Electronically Signed   MB/MedQ  DD: 04/14/2007  DT: 04/14/2007  Job #: [id number]   cc:   Duke Salvia. Eliott [name], [name]

## 2010-11-21 NOTE — Discharge Summary (Signed)
. [facility]  Patient:    [name] Day, [name] Day                    MRN: [id number] Adm. Date:  [id number] Disc. Date: 09/27/00 Attending:  Veneda Melter Dictator:   Chinita Pester, N.P. CC:         Veneda Melter, [name]  [name], [name] [facility]  Aram Beecham B. Eliott Nine, [name]   Discharge Summary  PRIMARY DIAGNOSES: 1. Chronic obstructive pulmonary disease. 2. Renal insufficiency. 3. Atrial fibrillation.  SECONDARY DIAGNOSES: 1. Congestive heart failure. 2. Rash of unknown origin.  HISTORY OF PRESENT ILLNESS:  This is a 77 year old gentleman with past medical history of COPD, atrial fibrillation, which is prominent for at least five years, and hyperlipidemia.  He was admitted in early March for catheterization which showed 30% LAD, 50% mid-LAD, normal circumflex, normal RCA with normal LV function.  Left atrium was mildly dilated with 1+ of more and moderate pulmonary hypertension.  At this time, it was decided to convert him to normal sinus rhythm since it has not been tried before.  He was started on amiodarone and verapamil.  His rates became slow and verapamil was discontinued.  His heart rate remains in the low 50s.  He is complaining of shortness of breath in the lower extremities as well as feeling very weak which has been progressively worsening.  HOSPITAL COURSE:  During hospitalization, the patient had a consult with EP for evaluation of his atrial fibrillation as well as [facility] for evaluation of his kidney function and Dr. Alroy Dust for his pulmonary.  The patients amiodarone was discontinued.  His Coumadin was placed on hold, and he was continued on Lovenox.  He remains in atrial fibrillation with a slow ventricular response off his anti-arrhythmics.  He did undergo TEE prior to his cardioversion because of inadequate anticoagulation the previous week. The patients creatinine had increased.  He was on hydrochlorothiazide as well as  Lasix and an ACE inhibitor.  The ACE inhibitor and hydrochlorothiazide were followed.  The patients creatinine did improve during his hospitalization although his BUN upon discharge was still elevated at 55.  His creatinine at discharge was 2.4.  The INR was 2.1.  The patient was cardioverted to a normal sinus rhythm which he had stayed in for a few days and then converted back into atrial fibrillation.  Decision was made to keep the patient in atrial fibrillation and control his response as well as continuing anticoagulation. During hospitalization, the patient did complain of an itch and a rash which has been ongoing for the past two months which she was going to follow with Dr. Kriste Basque for.  DISCHARGE MEDICATIONS:  The patient was discharged to home on:  1. Coumadin 7.5 mg nightly.  2. Zocor 10 mg nightly.  3. Doxepin 25 mg at bedtime.  4. K-Dur 10 mg daily.  5. Humibid 600 mg b.i.d.  6. Actos 15 mg daily.  7. Zyrtec 10 mg daily.  8. Ultram 50 mg as needed.  9. Rocaltrol 0.5 mcg daily. 10. Combivent two puffs four times a day. 11. Flovent two puffs twice a day. 12. Lasix 60 mg daily.  DISCHARGE INSTRUCTIONS:  Low fat, low cholesterol, low salt diet.  He is to have a B-MET and a urine C&S within one week.  PT/INR on Wednesday, September 29, 2000, at 10:30 a.m. at the [facility] at [place].  FOLLOW-UP:  He is to follow up with Dr. Kriste Basque  on October 07, 2000, as previously scheduled.  He is to schedule an appointment with Dr. Eliott Nine in two to three weeks and Dr. Chales Abrahams was scheduled for October 19, 2000, at 10:15 p.m.  LABORATORY DATA:  During the hospitalization, the patient had a pulmonary function test performed which showed moderate obstructive disease, marked decrease in DSB, diffusing capacity and probable emphysema.  Ultrasound of the kidney was essentially unremarkable.  The patients follow-up chest x-ray showed resolution of CHF.  The patients last white count on September 26, 2000, was 9.9 with an H&H of 13 and 38.5.  Calcium was 8.6. DD:  09/27/00 TD:  09/27/00 Job: [id number]

## 2010-11-21 NOTE — Cardiovascular Report (Signed)
NAME:  DOREAN, DANIELLO NO.:  [id number]   MEDICAL RECORD NO.:  [id number]          PATIENT TYPE:  INP   LOCATION:  2014                         FACILITY:  [facility]   PHYSICIAN:  Arvilla Meres, [name] LHCDATE OF BIRTH:  07-Dec-1923   DATE OF PROCEDURE:  11/09/2005  DATE OF DISCHARGE:                              CARDIAC CATHETERIZATION   PATIENT IDENTIFICATION:  Mr. Hallquist is a 77 year old male with history of  severe heart failure, secondary nonischemic cardiomyopathy.  Medical history  is also notable for severe COPD and advanced renal insufficiency.  He  presented to clinic with class IIIB to IV heart failure symptoms was  admitted for a right heart catheterization.  His Coumadin was held, and he  was brought to the catheterization lab to assess his pressures and cardiac  output.   PROCEDURES PERFORMED:  Right heart catheterization.   DESCRIPTION OF PROCEDURE:  The risks and benefits of catheterization were  explained.  Consent was signed and placed on the chart.  A 7-French venous  sheath was placed in the right femoral vein.  A standard Swan-Ganz catheter  was used for the catheterization.  There are no apparent complications.   FINDINGS:  RA pressure was a 8, RV pressure was 31/3, PA pressure was 28/12  with a mean of 20, pulmonary capillary wedge pressure was a mean of 7.  Fick  cardiac output was 2.8 liters per minute.  Fick cardiac index was 1.5 liters  per minute per meter squared.  Thermodilution cardiac output was 2.5 liters  per minute.  Cardiac index was 1.3 liters per minute per meter squared.  Femoral arterial saturation was 95% on room air.  PA saturation was 48%.  Pulmonary vascular resistance (PVR) was 4.6 Woods units.   ASSESSMENT:  Low output heart failure with decreased filling pressures.   PLAN:  1.  Start dobutamine at 2.5 mcg/kg per minute.  2.  Decrease Lasix and hold beta blocker.  3.  We will consider possible follow-up right heart  catheterization later in      the week to assess the need for home inotropes.      Arvilla Meres, [name] [facility]  Electronically Signed     DB/MEDQ  D:  11/09/2005  T:  11/10/2005  Job:  [id number]   cc:   Duke Salvia. Eliott Nine, [name]  Fax: [phone]

## 2010-11-21 NOTE — Op Note (Signed)
NAME:  ROBERTA, KELLY NO.:  [id number]   MEDICAL RECORD NO.:  [id number]          PATIENT TYPE:  INP   LOCATION:  3732                         FACILITY:  [facility]   PHYSICIAN:  Kathlee Nations Trigt III, M.D.DATE OF BIRTH:  02/11/1924   DATE OF PROCEDURE:  05/24/2004  DATE OF DISCHARGE:                                 OPERATIVE REPORT   OPERATION:  Left chest tube placement for pneumothorax.   PREOPERATIVE DIAGNOSIS:  90% left pneumothorax.   POSTOPERATIVE DIAGNOSIS:  90% left pneumothorax.   SURGEON:  Kerin Perna, [name]   ANESTHESIA:  Local 1% lidocaine.   INDICATIONS FOR PROCEDURE:  The patient is a 77 year old white male with  CHF, low ejection fraction, and nonsustained VT.  An AICD was placed  yesterday via the left subclavian vein.  On this morning's chest x-ray, a  large 90% left pneumothorax was noted.  The patient was symptomatic with  shortness of breath and chest discomfort.   DESCRIPTION OF PROCEDURE:  The left chest was prepped and draped as a  sterile fashion.  A site was chosen 2 cm distal and below the AICD pocket  and the fourth interspace was infiltrated with lidocaine and a 10 blade stab  incision was made down to the intercostal muscle.  A 12 French catheter was  placed over a blunt tip needle into the pleural space and advanced.  The  catheter was connected to the Pleuravac drainage system and secured to the  skin with two silk sutures.  A sterile dressing was applied.  There was  appropriate fluctuation of the fluid level on the Pleuravac and breath  sounds were immediately improved.  A chest x-ray is pending.  There were no  complications.      Pete   PV/MEDQ  D:  05/24/2004  T:  05/24/2004  Job:  [id number]

## 2010-11-21 NOTE — Assessment & Plan Note (Signed)
[facility]                          CHRONIC HEART FAILURE NOTE   NAME:[name] Day, [name] Day                    MRN:          [id number]  DATE:08/31/2006                            DOB:          Dec 01, 1923    [name] Day returns today for followup regarding his end-stage  congestive heart failure which is secondary to nonischemic  cardiomyopathy.  [name] Day is being managed on a dobutamine drip  under the care of Hospice and has been relatively stable for the last  few months.  Today he is complaining of some mild increased shortness of  breath and increased fatigue with minimal exertion.  He also feels like  his abdomen is slightly distended.  Otherwise denies any fevers or  cough.  No orthopnea or PND.  No peripheral edema.  Appetite has been  good.  I last saw [name] Day earlier this month at which time he was in mild  volume overload, and I increased Lasix to 80 in the morning and 40 in  the evening.  He continues the dobutamine drip at 2.5 mcg.  Lab work  done at that time showed potassium 3.9, BUN 55, creatinine 2.7.  Lab  work drawn since that time by Hospice shows a BUN of 49, creatinine down  to 2.39.  The patient also had a PT/INR drawn by Hospice service on  February 22, and PT was 26.6 with INR of 2.3.  Otherwise the patient  states he is the same.   PAST MEDICAL HISTORY:  1. Congestive heart failure secondary to nonischemic cardiomyopathy      with EF of 30%, status post implantation of biventricular      cardioverter defibrillator, being maintained on dobutamine drip.  2. Atrial fibrillation, rate controlled.  3. Chronic renal insufficiency.  4. DNR status under the care of Hospice.  5. Diabetes.   REVIEW OF SYSTEMS:  As stated above.   MEDICATIONS:  1. Digoxin 0.125 mg on Monday, Wednesday, and Friday.  2. Furosemide 80 mg in the morning and 40 in the evening.  3. Hydralazine 50 mg 3 times a day.  4. Lipitor 20 mg.  5.  Methylprednisolone 4 mg.  6. Isosorbide 120.  7. Coumadin as directed  8. Calcitriol.  9. Senokot.  10.Prilosec.  11.DuoNeb.  12.Glyburide 4 mg daily.  13.Januvia 100 mg daily.  14.Klor-Con 20 mEq.  15.Dobutamine 2.5 mcg drip.   PHYSICAL EXAMINATION:  VITAL SIGNS: Weight today 177 pounds.  Weight is  up 2 pounds from February 4.  Blood pressure 120/64, pulse initially  recorded at 90.  Rechecked after patient had settled down in the room  after exertion.  He was 76.  GENERAL:  [name] Day is in no acute distress.  NECK:  Jugular venous distention 6-7 cm at 45-degree angle.  LUNGS:  Diminished breath sounds in bilateral bases.  CARDIOVASCULAR:  S1 and S2.  Positive S3.  ABDOMEN:  Positive bowel sounds, slightly distended.  LOWER EXTREMITIES:  Without clubbing, cyanosis, or edema.   IMPRESSION:  The patient is still in mild volume overload at this time.  I am going to have  him continue furosemide and repeat Zaroxolyn 2.5 mg  for the next 3 days.  If not improvement, his wife agrees to call me,  and we will make further adjustments in medications.  I feel that if the  Zaroxolyn is not working, he will need to have some IV diuretic therapy  which can be done at home through the Hospice nurse.   The patient would also like to have cataract surgery repair.  I will  need to discuss this with Dr. Gala Romney.  Hopefully, we will find an  answer later today and get back with patient.      Dorian Pod, ACNP  Electronically Signed      Rollene Rotunda, MD, [facility]  Electronically Signed   MB/MedQ  DD: 08/31/2006  DT: 08/31/2006  Job #: [id number]   cc:   Duke Salvia. Eliott Nine, [name]

## 2010-11-21 NOTE — Consult Note (Signed)
[name], [name] Day NO.:  [id number]   MEDICAL RECORD NO.:  [id number]          PATIENT TYPE:  INP   LOCATION:  2032                         FACILITY:  [facility]   PHYSICIAN:  Charlton Haws, [name]     DATE OF BIRTH:  06/23/1924   DATE OF CONSULTATION:  DATE OF DISCHARGE:                                   CONSULTATION   [name] Day is a pleasant 77 year old patient of Dr. Kriste Basque and Dr.  Gala Romney.  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  I remember him from  his recent hospitalization from May 3-21.  He had class IV heart failure  with home dobutamine.  He has had two recent heart catheterizations showing  improvement on dobutamine.  He was on 2.5 mcg/kg per day.  He has had PAF on  __________ with previous DC cardioversion and by echo in March 2006 his EF  was 35-40% with mild MR.   The patient has been having increased lifelessness, shortness of breath.  He  had a low-grade fever of 100 at home.  He saw Dr. Kriste Basque in the office for  his failure to thrive and shortness of breath, and Dr. Kriste Basque admitted him to  the hospital.   He does also have a history of COPD and has inhalers.   The patient's initial course in the hospital has been remarkable for low-  grade fever.  His sedimentation rate was 89.   He is somewhat prerenal with a BUN of 35 and a creatinine of 2.9.  His BNP  has gone from 760 on Nov 09, 2005, to 1039.   His INR was therapeutic at 2.3 on Coumadin.   Chest x-ray showed question of right lower lobe pneumonia with some  atelectasis and no frank heart failure.   The patient's weight has been fairly stable at 74.3 kg.  It has not really  gone up very much.   In talking to the patient, he has had a low-grade fever.  He has had a bit  of a cough with some greenish sputum.  He does note increased abdominal  fullness but not particularly any significant lower extremity edema.   Note should be made that he really is class IV congestive heart  failure and  very limited in what he can do.   He has a right PICC line in for his home dobutamine.   MEDICATIONS:  1.  Digoxin 0.125 mg Monday, Wednesday, Friday.  2.  Lasix 40 mg p.o. daily.  3.  Lipitor 20 mg daily.  4.  Hydralazine 50 mg t.i.d.  5.  Prednisone 4 mg a day.  6.  Isordil 120 mg a day.  7.  Warfarin 7.5 mg a day.  8.  Calcitriol.  9.  Mucinex 600 mg q.i.d.  10. Allopurinol 100 mg a day.  11. Omeprazole 20 mg a day.  12. Dobutamine 25 mcg/kg per minute.  13. Albuterol and Atrovent nebulizers.  14. Budesonide nebulizers b.i.d.  15. Combivent.   FAMILY HISTORY:  Noncontributory.   As indicated, he has spent a lot of time in the  hospital lately.  He is a  former Development worker, international aid at [employer] and knows most of the doctors fairly well.   PHYSICAL EXAMINATION:  VITAL SIGNS:  Remarkable for being V-paced with  probable underlying atrial fibrillation at a rate of 70.  Blood pressure is  127/60.  Weight is 163 pounds.  This morning he is afebrile.  His I&O's are  negative.  LUNGS:  Fairly clear without significant rales.  NECK:  JVD is elevated.  CARDIAC:  There is an S1, S2, with split second heart sound.  ABDOMEN:  There is positive hepatojugular reflux, but his abdomen is not  particularly tender.  EXTREMITIES:  There is trivial lower extremity edema.   EKG shows ventricular pacing.  Pertinent labs were as discussed in the H&P.   His initial enzymes are negative and his albumin is 2.9.   IMPRESSION:  I would agree with initial assessment by Dr. Kriste Basque that there  is a combination upper respiratory infection or pneumonia with his baseline  congestive heart failure.  I would not increase his diuresis that much.  He  appears stable in weight, and he is prerenal by kidney numbers and I suspect  that just changing his 40 mg p.o. dose to IV will be sufficient.  We will  maintain his dobutamine dose.  He has been placed on antibiotics by Dr.  Kriste Basque.  I think that the chest  x-ray, the focality of the chest x-ray and  the sedimentation rate of 89 would also go along with an infectious process.   Particularly since he is on low-dose prednisone, a fever of 100 may actually  have represented a higher number if he was no immunosuppressed.   We will be happy to follow the patient here in the hospital.  Hopefully, he  will get his baseline status but he is extremely fragile with his class IV  heart failure.           ______________________________  Charlton Haws, [name]     PN/MEDQ  D:  12/18/2005  T:  12/18/2005  Job:  [id number]

## 2010-11-21 NOTE — Assessment & Plan Note (Signed)
[facility]                          CHRONIC HEART FAILURE NOTE   NAME:[name] Day, [name] Day                    MRN:          [id number]  DATE:08/09/2006                            DOB:          [birth year]    [name] returns today for followup regarding his end-stage  congestive heart failure which is secondary to non-ischemic  cardiomyopathy. [name] current status remains relatively the  same. He saw Dr. Eliott Nine just recently. Lab work which visit I have  available. Creatinine has been maintaining around 2 to 2.8, which is  consistent for [name]. I had previously decreased his furosemide  from 80 in the a.m. and 40 in the evening to 40 mg b.i.d. However, Mr.  [name] Day showed some signs of volume overload on the last visit and I  had to treat him with Zaroxolyn 2.5 mg for three days. The patient's  weight stabilized with the additional Zaroxolyn. However, because of  increased heart failure symptoms I also increased his dobutamine dose to  2.5 mcg/kg IV. [name] really has not noticed much difference in  his energy level in the increase in dobutamine drip. Otherwise, no  changes in status.   PAST MEDICAL HISTORY:  1. Includes congestive heart failure secondary to non-ischemic      cardiomyopathy with ejection fraction of 30%. Status post      implantation of bi-ventricular cardioverter defibrillator.  The      patient is being maintained on dobutamine drip in the setting of      end-stage heart failure also under the care of hospice.  2. Atrial fibrillation, rate controlled.  3. Chronic renal insufficiency with a creatinine between 2 to 3.  4. DO NOT RESUSCITATE STATUS.  5. Diabetes.   REVIEW OF SYSTEMS:  As stated above.   CURRENT MEDICATIONS:  1. Amaryl 4 mg daily.  2. Januvia.  3. Coumadin [facility].  4. Lipitor 20 mg daily.  5. Hydralazine 50 mg t.i.d.  6. Digoxin 0.125 mg Monday, Wednesday and Friday.  7.  Methylprednisolone 4 mg daily.  8. Isosorbide 120 daily.  9. Calcitriol 0.25 mg every other day.  10.Senokot.  11.Prilosec.  12.Duo-Nebs.  13.Magnesium.  14.Klor-con 20 mEq daily.  15.Dobutamine 2.5 mcg.   PHYSICAL EXAMINATION:  Weight 175 pounds, blood pressure 89/53, pulse is  72. The patient in no acute distress.  JVD up to earlobes.  Breath sounds clear to auscultation bilaterally.  ABDOMEN: Soft and nontender, mildly distended. Positive hepatojugular  reflux.  CARDIOVASCULAR: S1, S2, positive S3. Irregularly irregular.  LOWER EXTREMITIES: Without edema.   IMPRESSION/PLAN:  The patient still in mild volume overload at this  time. I am going to increase his Lasix back to 80 mg in the morning and  40 in the evening. Will repeat lab work on him today. Continue  dobutamine at 2.5 mcg. Otherwise, no change in medications.      Dorian Pod, ACNP  Electronically Signed      Rollene Rotunda, MD, [facility]  Electronically Signed   MB/MedQ  DD: 08/09/2006  DT: 08/09/2006  Job #: [id number]   cc:  Duke Salvia Eliott Nine, [name]

## 2010-11-21 NOTE — Assessment & Plan Note (Signed)
[facility]                          CHRONIC HEART FAILURE NOTE   NAME:[name] Day, [name] Day                    MRN:          [id number]  DATE:07/20/2006                            DOB:          1924-02-23    PRIMARY CARDIOLOGIST:  Dr. Nicholes Mango   PRIMARY CARE PHYSICIAN:  Dr. Alroy Dust.   RENAL:  Dr. Camille Bal   [name] Day returns today for followup regarding his end-stage  congestive heart failure, secondary to nonischemic cardiomyopathy.  I  last saw [name] Day on the 11th of this month for follow up and that  time he was found to be in mild volume overload.  However, he was having  increased shortness of breath and increased fatigue.  A 12-lead EKG at  that time confirmed atrial fib at a controlled rate.  I had the patient  increase his Lasix to 80 mg q. a.m. and 40 mg in the p.m. for 3 days,  then resume his 40 mg b.i.d.  He returns today for follow up.  The  patient had recent blood work done through [employer] showing a BUN  at 39, creatinine 1.96 which was stable for patient.  Magnesium of 2.1.  [name] Day returns today stating he really cannot tell the difference  in how he feels.  Still very fatigued and short of breath with minimal  exertion.  His weight however has decreased and he states he has  urinated more.  He denies orthopnea and PND at this time.  Positive for  dyspnea on exertion, positive for increased fatigue.  Denies any  episodes of presyncope, syncope and no chest discomfort.   PAST MEDICAL HISTORY:  1. Includes congestive heart failure secondary to nonischemic      cardiomyopathy with an EF of 30, status post implantation of an      __________ cardioverter defibrillator being maintained on a      dobutamine drip at 1.25 mcg per kilogram per minute.  2. Atrial fib rate controlled.  3. Chronic renal insufficiency.  4. DNR status under the care of Hospice.  5. 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   REVIEW OF SYSTEMS:  As stated above.   CURRENT MEDICATIONS:  1. Amaryl 4.  2. Metformin 500.  3. Magnesium 400.  4. Klor-Con 20.  5. Coumadin per [facility].  6. Digoxin 0.125 on Monday's, Wednesday's and Friday's.  7. Furosemide 40 mg b.i.d.  8. Hydralazine 50 mg t.i.d.  9. Lipitor 20.  10.Methylprednisolone 4 mg.  11.Isosorbide 120.  12.Capitrol every other day.  13.Prilosec daily.  14.Dobutamine 1.25 mcg per kilogram per minute.  15.Senokot 2 tablets at bedtime.  16.Albuterol.  17.Atrovent .  18.Bucainide nebs for pulmonary.   PHYSICAL EXAMINATION:  Weight 177 pounds.  Weight is down 3 pounds from  last week.  Blood pressure 136/72 with a pulse of 74.  [name] Day is in no acute distress.  Neck veins are flat.  LUNGS:  Are clear to auscultation.  Positive bowel sounds.  Lower extremities without clubbing, cyanosis or edema.   IMPRESSION:  The patient's volume  overload appears to be stable at this  time, responded well to increased diuretic dose; however, still  complaining of ongoing fatigue and shortness of breath.  After further  discussion with Dr. Gala Romney, we have decided to increase his  Dobutamine to 2.5 mcg per kilogram.  I spoke with his Hospice nurse  regarding increased dose.  I would like it started tomorrow, also faxed  a prescription over to this home care regarding new dosage.  We will see  patient back in 1 to 2 weeks for reevaluation.      Dorian Pod, ACNP  Electronically Signed      Bevelyn Buckles. Bensimhon, MD  Electronically Signed   MB/MedQ  DD: 07/20/2006  DT: 07/21/2006  Job #: [id number]   cc:   [name], MD  [facility] Salvia Eliott Nine, [name]

## 2010-11-21 NOTE — Cardiovascular Report (Signed)
[place]  Patient:    [name] Day, [name] Day                    MRN: [id number] Proc. Date: 09/09/00 Adm. Date:  [id number] Attending:  Veneda Melter CC:         [name], [name] [facility]   Cardiac Catheterization  PROCEDURES PERFORMED: 1. Left heart catheterization. 2. Right heart catheterization. 3. Selective coronary angiography. 4. Perclose, right femoral artery.  DIAGNOSES: 1. Mild to moderate coronary artery disease by angiogram. 2. Moderate pulmonary hypertension. 3. Normal cardiac output. 4. Atrial fibrillation.  HISTORY OF PRESENT ILLNESS:  [name] Day is a 77 year old white male with a long-standing history of atrial fibrillation who presents with progressive lower extremity edema, dyspnea on exertion, and substernal chest discomfort. The patient presents for further cardiac assessment.  DESCRIPTION OF PROCEDURE:  Informed consent was obtained.  The patient was brought to the catheterization lab.  A 6-French arterial and an 8-French venous sheath were placed in the right groin using the modified Seldinger technique.  Right and left heart catheterizations were then performed in the usual fashion.  At the termination of the case, a Perclose suture closure device was deployed to the right femoral artery until adequate hemostasis was achieved.  The venous sheath was removed, and manual pressure was applied again until adequate hemostasis was achieved.  The patient tolerated the procedure well and was transferred to the floor in stable condition.  Findings are as follows:  FINDINGS:  Right heart catheterization:  R = mean of 13.  RV = 48/5.  PA = 54/9. Pulmonary capillary wedge pressure, mean = 17.  V wave is 28.  Aortic saturation is 91%.  PA saturation is 62%.  Cardiac output is 4.4 liters per minute with cardiac index of 2.1 liters per minute by thermodilution which is consistent with Fick.  Left heart catheterization: 1. Left  main trunk:  Large caliber vessel.  Mild irregularities. 2. LAD:  Large caliber vessel that provides 3 diagonal branches.  The LAD has    mild narrowing of 30% at the takeoff of the first diagonal branch.  There    is a further tubular narrowing of 50% after the takeoff of the second    diagonal branch.  The remainder of the vessel has mild irregularities. 3. Left circumflex artery:  Large caliber vessel that provides 5 marginal    branches.  There are luminal irregularities in the left circumflex system. 4. Right coronary artery:  Dominant.  This is a large caliber vessel that    provides a posterior descending artery in the terminal segment.  There are    luminal irregularities in the right coronary artery.  LV pressure is 110/0.  Aortic was 110/46.  LVEDP equals 12.  ASSESSMENT AND PLAN:  [name] Day is a 77 year old gentleman with noncritical coronary artery disease.  This will be medically managed.  The patient has had a long history of atrial fibrillation with significant mitral regurgitation and mild atrial enlargement.  He also has moderate pulmonary hypertension. Further medical management of his pulmonary hypertension will be pursued and consideration given towards electrical and chemical cardioversion. DD:  09/09/00 TD:  09/09/00 Job: [id number]

## 2010-11-21 NOTE — Assessment & Plan Note (Signed)
[facility]                          CHRONIC HEART FAILURE NOTE   NAME:[name] Day, [name] Day                    MRN:          [id number]  DATE:10/28/2006                            DOB:          12-08-1923    [name] returns today for follow up of his end-stage congestive  heart failure.  He is currently being maintained on a dobutamine drip  under the care of hospice.  [name] returns today secondary to  increased symptoms over the last few days.  I actually spoke with his  wife and him by phone earlier this week one evening.  He was complaining  of some increased shortness of breath and weakness with pending visit by  his hospice nurse at that time.  The hospice nurse called in on October 27, 2006 requesting that the patient be seen secondary to dizziness,  weakness and fatigue.  [name] states he first began feeling bad  last Saturday.  He underwent cataract surgery last Thursday and did okay  with that; however, on Saturday, he states he just felt a little weaker  than usual.  He was watching a baseball game.  He stood up to walk into  the kitchen.  When he got to the kitchen, he felt very dizzy and had to  lean against the table to wait for the dizziness to resolve, which it  eventually did, and since that time just has felt more weak, not  necessarily more dyspneic.  He states his appetite has been good.  He  has been drinking adequate amounts of fluids.  He denies any orthopnea  or PND.  No peripheral edema.  Nothing else has changed in regards to  his heart failure.  He denies any fever or chills.  He states that he is  actually feeling better today.  He has not had any dizziness since he  has been up today, and the weakness has not continued at this time.  He  continues to tolerate the dobutamine drip which is at 2.5 mcg.  The  patient is accompanied by his wife, who is status post hip surgery.  She  has been sick at home with a  sinus infection requiring antibiotics.  Otherwise, [name] has not been around anyone who has been sick  that he knows of.   PAST MEDICAL HISTORY:  1. Congestive heart failure secondary to nonischemic cardiomyopathy      with ejection fraction of 30%, status post implantation of a      biventricular cardioverter defibrillator without much improvement      in symptoms.  The patient is being maintained on a dobutamine drip      in the setting of end-stage heart failure under the care of      hospice.  2. Atrial fibrillation, rate controlled.  3. Chronic renal insufficiency with creatinine staying between 2 and      3, followed by Dr. Eliott Day.  4. Do not resuscitate status.  5. Diabetes with the last hemoglobin A1c in chart on September 29, 2006 of      7.3.  6.  Anticoagulation therapy with Coumadin.  7. Hyperlipidemia.  8. GERD.  9. History of prostate cancer.  10.Gout.  11.History of hyperparathyroidism.  12.Peripheral vascular disease status post bilateral renal artery      ultrasound showing normal caliber abdominal aorta with mild      atherosclerosis and normal kidney size bilaterally with the      appearance of chronic disease and being 1 to 59% bilateral renal      artery stenosis proximally.  13.COPD.  14.Non-obstructive CAD by cardiac catheterization in the past.   REVIEW OF SYSTEMS:  As stated above.   CURRENT MEDICATIONS:  1. Digoxin 0.125 mg on Mondays, Wednesdays and Fridays.  2. Furosemide 40 mg b.i.d.  3. Hydralazine 50 mg t.i.d.  4. Lipitor 20 mg q.h.s.  5. Methylprednisolone 4 mg daily.  6. Isosorbide 120 mg daily.  7. Coumadin 7.5 mg daily or as directed by clinic.  8. Calcitriol 0.25 mg every other day.  9. Senokot 2 tablets q.h.s.  10.Guaifenex DM two tablets b.i.d. p.r.n.  11.Prilosec 20 mg daily.  12.DuoNebs q.i.d.  13.Budesonide nebulizers 0.25 mg b.i.d.  14.Glimepiride 4 mg daily.  15.Januvia 100 mg q.h.s.  16.Magnesium 400 mg daily.   17.Klor-Con 20 mEq q.h.s.  18.Dobutamine 2.5 mcg drip.  The p.r.n. medications include;  1. Albuterol 100 mg.  2. Darvocet.  3. Tramadol 50 mg.  4. Loratadine 10 mg.  5. Combivent inhaler.   PHYSICAL EXAMINATION:  VITAL SIGNS:  Weight today 174 pounds, which is  down 3 pounds from the beginning of the month, heart rate 70, blood  pressure initially 130/66.  Orthostatic vitals lying - heart rate 70,  blood pressure 130/66.  Sitting heart rate 69, blood pressure 116/66.  Standing 0 minutes heart rate 71 with a blood pressure of 101/57; 2  minutes 75 and 112/60; at 5 minutes out, heart rate 71 with a blood  pressure of 115/65.  The patient without dizziness, presyncope or  syncopal episode during orthostatic vitals.  GENERAL:  [name] was in no acute distress.  He is his usual  pleasant self.  NECK:  He has no jugular venous distention at 45 degree angle.  LUNGS:  Clear bilaterally.  CARDIOVASCULAR:  An S1 and S2.  I do not hear an S3 at this time.  ABDOMEN:  Soft, nontender.  Positive bowel sounds.  Port-A-Cath site to  right chest dry and intact without redness around insertion site.  LOWER EXTREMITIES:  Without clubbing, cyanosis, or edema.  NEUROLOGIC:  Alert and oriented x3.  Ambulating with the assistance of a  cane.   IMPRESSION:  Stable end-stage heart failure at this time.  Continue  current medications.  I am not really sure what occurred over the  weekend that caused [name] increased fatigue and dizziness.  He  is not orthostatic at this time.  He has not complained of any dizziness  today and no further weakness.  It could be he had a virus or just part  of the pathway of his heart failure.  [name] continues to state he  has more good days than bad days.  His quality of life is stable at this  time.  He is somewhat depressed at times about the things that he is  unable to do that he would like to do, especially playing golf; however, he realizes that  he actually has responded better to the inotropic  therapy, and has actually been very stable for the last 10 months.  I  will continue to follow him closely.  I have asked [name] Day, his wife,  to call me on Monday and let me know how he is doing.  If no  improvement, then I will see him back next Friday.      Dorian Pod, ACNP  Electronically Signed      Rollene Rotunda, MD, [facility]  Electronically Signed   MB/MedQ  DD: 10/28/2006  DT: 10/28/2006  Job #: [id number]   cc:   Duke Salvia. [name] Day, [name]  [name], MD

## 2010-11-21 NOTE — Discharge Summary (Signed)
[name], [name] Day             ACCOUNT NO.:  [id number]   MEDICAL RECORD NO.:  [id number]          PATIENT TYPE:  INP   LOCATION:  3732                         FACILITY:  [facility]   PHYSICIAN:  Maple Mirza, P.A. DATE OF BIRTH:  May 30, 1924   DATE OF ADMISSION:  05/19/2004  DATE OF DISCHARGE:  06/04/2004                                 DISCHARGE SUMMARY   DISCHARGE DIAGNOSES:  1.  Admitted with progressive weakness, anorexia, nausea, vomiting, elevated      serum blood glucose to 300 to 400 mg/dl:  unable to walk from room to      room.  2.  Admitted with atrial fibrillation, rapid ventricular rate responding to      IV Cardizem.  3.  Health status possibly exacerbated by a reaction to recent therapy for      Helicobacter pylori gastritis.  The patient currently carries allergies      to penicillin, Flagyl, Biaxin.  4.  Nonischemic cardiomyopathy, ejection fraction of 25% to 30% by      echocardiogram May 09, 2004.  5.  Left heart catheterization May 19, 2004.  The LAD had a 40%      proximal stenosis, 30% distal stenosis.  The left main had a 20%      stenosis.  The left circumflex and right coronary artery were free of      significant disease.  6.  Class III-IV congestive heart failure (admission BNP is 2950).  7.  Nonsustained ventricular tachycardia.  8.  Left bundle branch block with QRS greater than 160 ms.  9.  On the day of transfer to the SACU, the patient was post procedure,      status post implant of Guidant CONTAK Renewal III, model H170      cardioverter defibrillator.  10. Post ICD implantation, left apical pneumothorax with left-sided      thoracostomy tube treatment and subsequent recurrence requiring      replacement of left thoracostomy tube for a further 7 days of therapy.  11. Respiratory culture positive for Pseudomonas aeruginosa, treated with      ciprofloxacin.  12. Debilitation from enforced immobility with chest tube in place.   SECONDARY DIAGNOSES:  1.  Persistent atrial fibrillation on chronic Coumadin since 1985, status      post failed DCCV in 2002.  2.  Chronic renal insufficiency with an admission creatinine of 2.1.      Creatinine on June 03, 2004 of 2.1.  3.  Chronic obstructive pulmonary disease/emphysema.  4.  Extracranial cerebrovascular occlusive disease with known left internal      carotid artery stenosis.  5.  Bilateral renal artery stenosis status post stenting in 2003.  6.  Pulmonary hypertension.  7.  Hypertension.  8.  Dyslipidemia.  9.  Diabetes mellitus.  10. Moderate mid to distal esophageal dysmobility.  History of esophageal      dilatation.  11. History of prostate cancer, status post prostatectomy in 1990, status      post transurethral resection of the prostate.  12. Gastroesophageal reflux disease/hiatal hernia.  13. Degenerative joint disease status post  left total knee arthroplasty.  14. Diverticulosis.   PROCEDURES:  1.  May 19, 2004 - left heart catheterization.  This study showed that      the left main had a 20% stenosis, and the left anterior descending a 40%      proximal, 30% distal stenoses.  Left circumflex and right coronary      artery were free of significant disease.  2.  May 09, 2004 - echocardiogram.  Ejection fraction 25% to 30%.      Unable to assess regional wall motion abnormalities; however, there was      severe diffuse left ventricular hypokinesis.  Compared to echocardiogram      of June 2004, ejection fraction 35% to 45% with akinesis of septal wall      and moderate mitral regurgitation.  3.  May 23, 2004 - implantation of Guidant [facility] III model      H170, serial number [id number] without defibrillation threshold testing.      This is a BiV/cardioverter defibrillator by Dr. Sherryl Manges.  4.  May 24, 2004 - insertion of left subclavian small bore thoracostomy      tube, which proved insufficiency to control large air leak  through Pleur-      Evac.  5.  May 26, 2004 - left interior thoracostomy tube catheter removed,      and 20 French left thoracostomy tube placed into pleural space, Dr.      Kathlee Nations Trigt, with improvement in left lung aeration.  6.  May 28, 2004 - reinsertion of left thoracostomy tube after left      thoracostomy tube had just been pulled hours previously.  This tube was      left in place for a total of 7 days with several days on water seal      prior to eventual discontinuance on June 03, 2004.   DISCHARGE DISPOSITION:  [name] Day is certainly a candidate for  SACU or skilled nursing facility.  The patient has had a 28-day stay here at  first [facility], then [facility].  His initial  presentation with dyspnea, weakness, and chest tightness secondary to atrial  fibrillation, rapid ventricular rate, has resolved.  The patient was found  to have severe nonischemic cardiomyopathy on left heart catheterization  May 19, 2004, and with concurrent symptoms such as class III to IV  congestive heart failure, left bundle branch block with QRS greater than 160  ms, nonsustained ventricular tachycardia, the patient would qualify or  cardiac resynchronization therapy, as well as cardioverter defibrillator  under the COMPANION trial study.  This was done on May 23, 2004.  The  device was placed and is working without complication, but the patient  experienced a left apical pneumothorax after the procedure, requiring a  chest thoracostomy tube therapy with recurrence after the first thoracostomy  tube was removed, occasioning a second thoracostomy tube, which was placed  for a 7-day period.  While recovering from the left apical pneumothorax, the  patient has been treated for Pseudomonas aeruginosa pneumonia with the  ciprofloxacin, and has proven to be fairly weak and debilitated secondary to immobility from this prolonged  hospitalization.  The patient will discharge  on the follow medications.   DISCHARGE MEDICATIONS:  1.  Protonix 40 mg twice daily.  2.  Calcitriol 0.5 mcg daily.  3.  Niacin 1000 mg at bedtime.  4.  Guaifenesin 600 mg twice daily.  5.  Zocor 40 mg daily at bedtime.  6.  Enteric-coated aspirin 81 mg daily.  7.  Atrovent nebulizer therapy, 0.5 mg four times daily.  8.  Xopenex 1.25 mg nebulizer therapy four times daily.  9.  Digoxin 0.125 mg Monday, Tuesday, Wednesday, Friday, and Saturday.  10. Coreg 6.25 mg b.i.d.  11. Senokot S two tablets at bedtime.  12. Miconazole cream 2%, apply topically.  13. Magic mouthwash 5 cc four times daily (do not give within 2 hours of      ciprofloxacin therapy).  14. Lasix 40 mg daily.  15. Cipro 400 mg IV q.24h.  16. For pain, the patient has Percocet 5/325, 1-2 tablets q.4h. as needed      for pain.  17. Robitussin 5 cc q.8h. as needed for cough.  18. Fleece enema 133 cc as needed.  19. Coumadin currently dosing per pharmacy.  The patient's INRs have      currently been averaging 2.4 to 2.9 on the current regimen.   BRIEF HISTORY:  Mr. Zinda was admitted to [facility] on  May 08, 2004 with progressive weakness, anorexia, nausea, vomiting, and  elevated CBGs.  He had been recently diagnosed with Helicobacter pylori, and  tried on penicillin, Flagyl, and Biaxin therapy, which apparently did not  agree with the patient.  On admission to [facility], he was found also to  be in atrial fibrillation with rapid ventricular rate, which responded to  Cardizem rate control.  He was admitted with class III to class IV  congestive heart failure with a BNP on admission of 2950.  The patient gives  a history of inability to walk from room to room without dyspnea.  Symptoms  had apparently been maturing for some time prior to his admission on  May 09, 2004.  His wife said when playing golf about 3 weeks prior to  this admission, his  partner had come back saying that that man has no  reserve energy.  The patient gives a long-term history of difficulty with  inclines.  He has to stop halfway up any staircase to catch his breath.  For  the last week, the patient has been afraid to go to sleep at night for fear  of the unknown.  The patient's symptoms including shortness of breath,  dyspnea on exertion, orthopnea, paroxysmal nocturnal dyspnea, edema, and  palpitations.  The patient also gives a recent history of fatigue, GERD,  urinary urge incontinence.  The patient will be admitted, stabilized, serum  glucose brought under control, heart rate controlled with Cardizem, and  subsequently, when more stable with beta blockade and discontinuance of  Cardizem, which is a negative inotrope, he will also need left heart  catheterization and renal consult, since his admission creatinine is  elevated at 2.1.   HOSPITAL COURSE:  The patient admitted in atrial fibrillation, rapid ventricular rate.  The patient has known atrial fibrillation, but required  supplemental therapy to bring the rate under control.  He underwent left  heart catheterization on May 19, 2004, which showed no significant  coronary artery disease, but an echocardiogram which had been done on  admission to [facility] showed severe depression and an ejection  fraction of 25% to 30%, and severe diffuse left ventricular hypokinesis.  The patient was seen by Dr. Sherryl Manges, electrophysiologist, who  recommended placement of a BiV/ICD under recommendations by the COMPANION  study, which include severe cardiomyopathy, class III to IV congestive heart  failure, known  nonsustained ventricular tachycardia, wide QRS, with left  bundle branch block.  The patient was also seen by the renal service, which  obtained an ultrasound which showed no hydronephrosis and simple cysts in  the kidneys.  There was no proteinuria, leading one to believe that   hypertension was the cause of his chronic renal insufficiency, and not  diabetes.  Medications were adjusted for renal dosage throughout this  hospitalization.  The patient was maintained either on IV heparin or  Coumadin, with the only lapse in therapy being prior to implantation of ICD.  Subsequent to ICD implantation, on May 23, 2004 the patient developed  left apical pneumothorax.  The patient has severe bolus emphysema, which  exacerbated the chest tube leak.  The __________ chest tube was unable to  successfully decompress the left pneumothorax, requiring left lateral  thoracostomy tube placement by Dr. Donata Clay.  Unfortunately, after removal  of this lateral tube, the patient developed recurrent left pneumothorax, and  the left thoracostomy tube had to be replaced.  This was put in place for a  7-day period, and only after careful consideration  and stability on __________ for 72 hours was the chest tube finally removed.  The patient is severely debilitated by symptoms actually preceding  hospitalization on May 09, 2004, certainly not improved by the features  of this hospitalization.  The patient will require SACU or skilled nursing  facility at this discharge juncture.       GM/MEDQ  D:  06/03/2004  T:  06/03/2004  Job:  [id number]   cc:   [name], [name] [facility]   Charlton Haws, [name]   Duke Salvia, [name]

## 2010-11-21 NOTE — Op Note (Signed)
[name], [name] Day NO.:  [id number]   MEDICAL RECORD NO.:  [id number]          PATIENT TYPE:  INP   LOCATION:  3732                         FACILITY:  [facility]   PHYSICIAN:  Duke Salvia, [name]  DATE OF BIRTH:  08-27-1923   DATE OF PROCEDURE:  05/23/2004  DATE OF DISCHARGE:                                 OPERATIVE REPORT   PREOPERATIVE DIAGNOSIS:  Congestive heart failure with nonischemic  cardiomyopathy, left bundle branch block, and persistent atrial  fibrillation.   POSTOPERATIVE DIAGNOSIS:  Congestive heart failure with nonischemic  cardiomyopathy, left bundle branch block, and persistent atrial  fibrillation.   PROCEDURE:  Dual-chamber defibrillator implantation; cardiac  resynchronization therapy; and contrast venography.   Following the obtaining of informed consent, the patient was brought to the  electrophysiology laboratory and placed on the fluoroscopic table in supine  position.  After routine prepping and draping of the left upper chest,  lidocaine was infiltrated in the prepectoral subclavicular region and an  incision was made and carried down to the layer of the prepectoral fascia  using electrocautery and sharp dissection.  A pocket was formed similarly,  hemostasis was obtained.   Thereafter, attention was turned to gaining access to the extrathoracic left  subclavian vein, during which time I aspirated air on one occasion.  This  again, as earlier today, resulted from a more lateral approach.  Subsequently the vein was cannulated.  A double-wire approach was used to  allow for the AV guidewires, and then subsequently a 9 [place] tear-away  introducer sheath was placed over the middle guidewire and through this was  passed a Guidant 0158 dual-coil active-fixation integrated bipolar lead,  serial number [id number].  Under fluoroscopic guidance it was manipulated to the  right ventricular apex and actually the tip was placed up on the septum  a  little bit for adequate R-wave sensing.  At this point the R-wave was 18.1  mV with a pacing impedance of 1090 Ohms, a threshold of 0.6 V at 0.5 msec.  Current at threshold was 1.4 mA, and there was no diaphragmatic pacing at 10  volts.   At this point an extended hook CS sheath was used with a Wholey wire through  a 9.5 [place] sheath, and the coronary sinus was cannulated with modest  difficulty.  Contrast venography demonstrated a lateral branch and a  posterior branch, and the lateral branch was then targeted.  Using a Whisper  wire and initially an EZ-Track 2 and subsequently an EZ-Track 3, and at the  end a Medtronic 4194 88-cm passive-fixation bipolar lead, serial number  [id number].  This vein was targeted.  The problem with the initial bipolar  lead was that in its end-vessel location, there was diaphragmatic  stimulation in all pacing configurations.  It was then elected to try the EZ-  Track 3; however, the diameter of the vessel was not sufficiently large,  however, to allow for inclusion of the entire coil, leaving the proximal  part of the coil in the body of the coronary sinus with then the potential  for retraction of the lead  out of the vein body.   Because of this, these leads were abandoned and a Medtronic bipolar lead was  utilized, which would allow [place] to sit in the vein body itself.  In this  location the bipolar L-wave was 12.3 mV with a pacing impedance of 1105 Ohms  and a threshold of 1 V at 0.5 msec.  Current at threshold was 1.2 mA, and  there was no diaphragmatic pacing at 10 volts.   The atrial lead had been passed in between the initial LV lead and the  second LV lead.  This was a Medtronic 5076 52-cm lead, serial number  [id number].  The bipolar fibrillation wave was 1 mV with a pacing impedance  of 540 Ohms.  These leads were secured to the prepectoral fascia.  The CS  delivery system was removed.  It should be noted that the CS delivery system  had to  be replaced on one occasion because during a wire transfer, this  system prolapsed.  This was ultimately accomplished and the leads were then  attached to a [employer] III model H170 ICD, serial number  [id number].  Through the device the bipolar fibrillation wave was 0.9 mV with a  pacing impedance of 535 Ohms, a threshold of 14.1 mV, with a pacing  impedance of 1075 Ohms, and a threshold of 0.2 V at 0.5 msec with an R-wave  of 15.1 with an impedance of 659, a threshold of 0.8-0.5 msec.  The high  voltage impedance was 42 Ohms.   Because of the concern about pneumothorax and because the patient had been  on the table for awhile, it was elected not to undertake DFT testing at this  time.  Further, his INR was below 2 and so if we ended up with sinus rhythm,  we would have had to put him back in atrial fibrillation anyway.  Because of  this, I elected to defer DFT testing and will undertake it electively.   The pocket was copiously irrigated with antibiotic-containing saline  solution.  Hemostasis was assured, and the leads and the pulse generator  were then placed in the pocket and secured to the prepectoral fascia.  The  wound was washed, dried, and a Benzoin and Steri-Strip dressing was applied.  Needle counts, sponge counts, and instrument counts were correct at the end  of the procedure according to the staff.   The patient tolerated the procedure without apparent complication.       SCK/MEDQ  D:  05/23/2004  T:  05/24/2004  Job:  [id number]   cc:   [name], [name] [facility]   Electrophysiology Laboratory   [facility]

## 2010-11-21 NOTE — Cardiovascular Report (Signed)
[name], [name] Day NO.:  [id number]   MEDICAL RECORD NO.:  [id number]          PATIENT TYPE:  INP   LOCATION:  2014                         FACILITY:  [facility]   PHYSICIAN:  Arvilla Meres, [name] LHCDATE OF BIRTH:  [birth year]   DATE OF PROCEDURE:  11/18/2005  DATE OF DISCHARGE:                              CARDIAC CATHETERIZATION   PRIMARY CARE PHYSICIAN:  [name], [name]   NEPHROLOGIST:  Duke Salvia. Eliott Nine, [name]   CARDIOLOGIST:  Arvilla Meres, [name]   PATIENT IDENTIFICATION:  [name] Day is a 77 year old male with severe COPD  and end-stage congestive heart failure secondary to a nonischemic  cardiomyopathy. He was admitted about a week ago with class 4 heart failure  symptoms and underwent right heart cath which showed a thick cardiac index  of about 1.5 liters/minute/m2 with low filling pressures, wedge pressure of  7. He was started on dobutamine with marked improvement in his symptoms and  his renal function. He has been treated with dobutamine for about a week and  was then weaned off with recurrence of his symptoms. Thus is being brought  back to the catheterization lab on dobutamine to look for improvement in his  cardiac output in order to qualify for home inotropic therapy.   PROCEDURE PERFORMED:  Right heart cath performed on dobutamine 2.5  mcg/kg/minute.   DESCRIPTION OF PROCEDURE:  The risks and benefits of catheterization were  explained to [name] Day and his family. Consent was signed and placed on  his chart. A 7 French venous sheath was placed in his right femoral vein  using a modified Seldinger technique. A standard Swan-Ganz catheter was used  for the procedure, there was no apparent complications.   FINDINGS:  Right atrial pressure mean of 7, RV pressure 46/2, PA pressure  37/13 with a mean of 23, pulmonary capillary wedge pressure mean of 13,  thick cardiac output is 6.1 liters/minutes, cardiac index is 3.1  liters/minute/m2. Thermodilution cardiac output 5.3 liters/minute with a  cardiac index of 2.7 liters/minute/m2.   ASSESSMENT/PLAN:  Congestive heart failure secondary to severe nonischemic  cardiomyopathy with low output state with greater than 100% improvement in  cardiac index on dobutamine therapy. Will plan for home inotropic therapy.      Arvilla Meres, [name] [facility]  Electronically Signed     DB/MEDQ  D:  11/18/2005  T:  11/19/2005  Job:  [id number]   cc:   [name], [name] [facility]  [address]  [place]  [place] [address]   Duke Salvia. Eliott Nine, [name]  Fax: [phone]

## 2010-11-21 NOTE — Assessment & Plan Note (Signed)
[facility]                          CHRONIC HEART FAILURE NOTE   NAME:[name] Day, [name] Day                    MRN:          [id number]  DATE:07/16/2006                            DOB:          Jul 22, 1923    Nephrology is Dr. Aram Beecham B. Eliott Nine, [name]   Mr. Thurman returns today for followup regarding his endstage  congestive heart failure secondary to nonischemic cardiomyopathy.  Mr.  Strebeck is currently under the care of Hospice and being maintained on  a dobutamine drip.  He states he has been doing poorly lately.  He felt  like he had gotten a cold approximately 2 weeks ago, and states he felt  like he has not returned to his baseline yet, complaining of increased  fatigue.  Denies any fevers or productive cough.  Hospice/Home Care has  been following his dobutamine.  No problems regarding that.  Denies  orthopnea or PND.  No peripheral edema.   PAST MEDICAL HISTORY:  1. Congestive heart failure secondary to nonischemic cardiomyopathy      with and EF of 30%.  Status post implantation of a bi-V      cardioverter-defibrillator, being maintained on dobutamine drip.  2. Atrial fibrillation.  Rate controlled.  3. Chronic renal insufficiency.  4. DNR status, under the care of Hospice.  5. 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   REVIEW OF SYSTEMS:  As stated above.   CURRENT MEDICATIONS:  1. Amaryl 4 mg daily.  2. Metformin 500 mg daily.  3. Magnesium 400 mg daily.  4. Klor-Con 20 mEq daily.  5. Coumadin per [facility].  6. Digoxin 0.125 mg on Monday, Wednesday and Friday.  7. Furosemide 4 mg b.i.d.  8. Hydralazine 50 mg t.i.d.  9. Lipitor 20.  10.Methylprednisolone 4 mg daily.  11.Isosorbide 120 mg daily.  12.Calcitriol every other day.  13.Prilosec daily.  14.Dobutamine 2.5.  15.Senokot 2 tablets at bedtime.  16.Albuterol nebs q.i.d.  17.Atrovent nebs q.i.d.  18.Bucainide nebs b.i.d.   PHYSICAL EXAMINATION:   Weight 180 pounds.  Patient states he weighed 170  at home.  This has been his continuous weight.  Blood pressure 100/54  with a pulse of 70.  He has JVD at 7 cm at a 45 degree angle.  LUNGS:  He has fine crackles at bilateral bases.  CARDIOVASCULAR EXAM:  There is an S1 and S2.  Irregularly irregular.  Positive S3.  ABDOMEN:  Soft and non-tender.  Positive bowel sounds.  LOWER EXTREMITIES:  He has +1 edema in the left lower extremity.   Twelve-lead EKG shows atrial fibrillation, ventricular paced, and at 70.   IMPRESSION:  Patient in mild volume overload at this time.  I am going  to have him increase his Lasix to 80 q. a.m. and 40 mg in the p.m. for 3  days, then resume his 40 mg b.i.d.  Follow up in 2 weeks.  Patient  states he has a followup appointment with Dr. Kriste Basque and Dr. Eliott Nine on  the 23rd.  Most recent lab work on June 25, 2006 showed potassium of  4.5 and a  BUN of 39, creatinine 1.9, magnesium of 2.1.  Hopefully,  patient will have lab work drawn at Dr. Elza Rafter office, and she can fax  it to [place], if not I will draw labs when I see patient back in 1 week.      Dorian Pod, ACNP  Electronically Signed      Bevelyn Buckles. Bensimhon, MD  Electronically Signed   MB/MedQ  DD: 07/16/2006  DT: 07/17/2006  Job #: [id number]   cc:   [name], MD  D[facility] Sal[facility] Eliott Nine, [name]

## 2010-11-21 NOTE — Consult Note (Signed)
[facility]  Patient:    [name] Day, [name] Day                    MRN: [id number] Proc. Date: 09/21/00 Adm. Date:  [id number] Attending:  Veneda Melter CC:         Nathen May, [name] [facility]   Consultation Report  REQUESTING PHYSICIAN:  Nathen May, [name] [facility]  REASON FOR CONSULTATION:  Nephrology consultation was asked by Dr. Graciela Husbands to evaluate this patients renal failure.  HISTORY OF PRESENT ILLNESS:  The patient is a 77 year old male with a history of atherosclerotic cardiovascular disease and a history of atrial fibrillation admitted with congestive heart failure. He has known COPD, but pulmonary symptoms of dyspnea have worsened recently. He underwent DC cardioversion today and has converted to normal sinus rhythm. He has known chronic renal insufficiency and has seen Dr. Eliott Nine in the past in our office, last seen several years ago. (Records have been requested and are off site). He reports that he took Advil for arthritis of his hips for approximately 15 years, but has not done this since he saw Dr. Eliott Nine. Heart catheterization was done on September 09, 2000 and at that time apparently no extensive prehydration was performed.  LABORATORY AND ACCESSORY DATA: Recently BUN and creatinine on September 18, 2000 was 34.0 and 2.4, on September 19, 2000 33.0 and 2.5, on August 31, 2000 BUN 33.0, creatinine 2.2.  PREADMISSION MEDICATIONS:  1. Tramadol.  2. Zantac.  3. Zyrtec.  4. ___________.  5. Simvastatin.  6. Doxepin.  7. Coumadin.  8. Lasix.  9. Potassium. 10. Amiodarone. 11. Lisinopril. 12. Hydrochlorothiazide/triamterene. 13. Hydroxyzine. 14. Actos.  PAST MEDICAL HISTORY:  1. Hyperlipidemia.  2. Chronic obstructive pulmonary disease.  3. Atrial fibrillation with anticoagulant therapy.  4. History of prostate cancer, status post prostatectomy.  5. Left hip replacement.  6. Tonsillectomy.  7. Gastroesophageal reflux  disease.  8. Peripheral vascular disease.  9. Hypertension. 10. Diabetes. 11. Mitral valve prolapse. 12. Atrial fibrillation, chronic. 13. Status post colon polypectomy. 14. Prior cigarette smoker. 15. Does not consume alcoholic beverages.  ALLERGIES:  PENICILLIN.  PHYSICAL EXAMINATION:  VITAL SIGNS:  Blood pressure 118/80, afebrile. He is status post DC cardioversion.  GENERAL:  He is awake. He is an elderly Caucasian male.  HEENT:  No abnormalities.  NECK:  Neck veins not distended, otherwise normal abnormalities.  LUNGS:  Slight kyphosis, decreased breath sounds, coarse breath sounds bilaterally with some rales right base that clear with breathing.  CARDIOVASCULAR:  Regular rhythm.  ABDOMEN:  Soft. There is a ventral hernia that is present.  EXTREMITIES:  One plus bilateral edema.  NEUROLOGICAL:  No obvious focality.  LABORATORY AND ACCESSORY DATA:  Chest x-ray on admission:  COPD and congestive heart failure changes.  ASSESSMENT:  Chronic renal failure with minimal exacerbation, possibly secondary to congestive heart failure.  RECOMMENDATIONS: 1. Agree with use of Leu diuretic, however, I would increase the dose from    40 to 80 mg per day and discontinue the Maxzide because of the    extent of the renal dysfunction. 2. Will obtain our old medical records (which are currently in storage) and    see what workup has been done previously.  Thank you for letting me see this patient. We will follow with you. DD:  09/21/00 TD:  09/22/00 Job: [id number]

## 2010-11-21 NOTE — Discharge Summary (Signed)
NAME:  [name], Day NO.:  [id number]   MEDICAL RECORD NO.:  [id number]          PATIENT TYPE:  INP   LOCATION:  2014                         FACILITY:  [facility]   PHYSICIAN:  Arvilla Meres, [name] LHCDATE OF BIRTH:  1923/07/16   DATE OF ADMISSION:  11/05/2005  DATE OF DISCHARGE:  11/23/2005                                 DISCHARGE SUMMARY   PRIMARY CARDIOLOGIST:  Arvilla Meres, [name]   PULMONOLOGIST:  [name], [name]   NEPHROLOGIST:  Duke Salvia. Eliott Nine, [name]   PRINCIPAL DIAGNOSIS:  Nonischemic dilated cardiomyopathy/congestive heart  failure.   OTHER DIAGNOSES:  1.  Status post Guidant biventricular ICD.  2.  History of bilateral renal artery stenosis.  3.  Nonobstructive coronary artery disease.  4.  Chronic obstructive pulmonary disease.  5.  Permanent atrial fibrillation.  6.  Chronic anticoagulation.  7.  Hyperlipidemia.  8.  History of prostate carcinoma.  9.  Gastroesophageal reflux disease.  10. Hyperparathyroidism.  11. History of gout.   ALLERGIES:  PENICILLIN.   PROCEDURES:  1.  Left and right heart cardiac catheterization.  2.  Repeat right heart cardiac catheterization on dobutamine.   HISTORY OF PRESENT ILLNESS:  A 77 year old white male who is followed in the  [place] heart failure clinic with a history of nonobstructive coronary  artery disease, and dilated nonischemic cardiomyopathy with concomitant  renal insufficiency.  He was recently seen in heart failure clinic by Dr.  Gala Romney on [id number], during which he was found to have worsening symptoms  of heart failure, secondary to low output state.  A decision was made to  admit him from the office for further evaluation.   HOSPITAL COURSE:  Mr. [name] underwent right heart cardiac catheterization  on May7 which revealed a PA pressure of 20/12 mm, a mean of 20, and a wedge  of 7.  His cardiac output was 2.8 with an index of 1.5.  Decision was made  to initiate dobutamine  at 2.5 mcg/k/min.  His Lasix dose was decreased and  his beta-blocker was held.  He was also placed on hydralazine therapy and on  inotropic support.  He had symptomatic improvement as well as improvement in  serum creatinine.  Dobutamine was eventually discontinued on May12; however,  off of dobutamine the patient had increasing fatigue as well as significant  decrease in exercise tolerance and increase in creatinine.   Decision was made, at that point, to reinitiate dobutamine therapy on May15.  We then sought case management assistance in pursuing home dobutamine  therapy.  With resumption of dobutamine therapy Mr. [name], again, began  to feel better today; and underwent repeat right heart cardiac  catheterization on May17.  This time revealing a PA of 37/13 with a mean of  23 and a wedge of 13.  His cardiac output was measured at 6.1 with an index  of 3.1 up from 1.5 on May7.  We then turned out attention to his chronic  atrial fibrillation feeling that he would likely benefit from restoration of  sinus rhythm with hopes of weaning off the dobutamine in the future.  He has been placed on amiodarone therapy and underwent cardioversion on  May18 which, unfortunately, was not successful.  Earlier today, May21,we  again attempted cardioversion; however, again, after restoration of sinus  rhythm for a short period time he resumed atrial flutter.  Decision has been  made to discharge him today.  We will continue him on amiodarone at 400 mg  b.i.d. x2 weeks, then 400 mg daily x1 month and then 200 mg daily.  We will  have him follow-up in heart failure Clinic later this week; we will  reconsider outpatient cardioversion down the road.  Arrangements have been  made with advanced home care for 24-hour dobutamine infusion at home.  Mr.  [name] has been ambulating with physical therapy and the nursing staff  without much difficulty or significant dyspnea.  He is being discharged home   today in satisfactory condition.   DISCHARGE LABS:  Hemoglobin 11.4, hematocrit 33.3, WBC 9.3, platelets 223.  PT 24.4, INR 2.2.  Sodium 139, potassium 3.6, chloride 105, CO2 25, BUN 58,  creatinine 3.0, glucose 98, calcium 8.8.  BNP 763.0 on May16.  TSH 1.076, T4  of 5.1 T3 of 79.6, iron 50, TIBC 213, ferritin 162, Digoxin 0.6.   DISPOSITION:  The patient is being discharged home today in fair condition.   FOLLOWUP PLANS AND APPOINTMENTS:  He has a followup appointment with Dr.  Gala Romney in heart failure clinic on May25 at 3:00 p.m..  He should likely  have an INR check, at that time, with followup in Coumadin Clinic arranged  at that time.   DISCHARGE MEDICATIONS:  1.  Amiodarone 400 mg b.i.d. x2 weeks, then 400 mg daily x1 month, then 200      mg daily.  2.  Digoxin 0.125 mg every Monday-Wednesday-Friday.  3.  Albuterol inhaler q.i.d.  4.  Atrovent inhaler q.i.d.  5.  AeroBid inhaler 44 mcg b.i.d.  6.  Potassium chloride 20 mEq daily.  7.  Isosorbide mononitrate 120 mg daily.  8.  Hydralazine 50 mg q.8 h.  9.  Lasix 40 mg daily.  10. Allopurinol 100 mg daily.  11. Lipitor 20 mg daily.  12. Dobutamine 2.5 mcg/k/min.  13. Prilosec 20 mg daily.  14. Coumadin as previously prescribed.   OUTSTANDING LAB STUDIES:  None.  Duration discharge encounter 60 minutes  including physician time.      Ok Anis, NP      Arvilla Meres, [name] [facility]  Electronically Signed    CRB/MEDQ  D:  11/23/2005  T:  11/24/2005  Job:  [id number]   cc:   Duke Salvia. Eliott Nine, [name]  Fax: [phone]   [name], [name] [facility]  [address]  [place]  [place] [address]   Salvadore Farber, [name] [facility]  [address]  [address]  [place]  [place] [address]

## 2010-11-21 NOTE — Discharge Summary (Signed)
[name] Day, [name] Day             ACCOUNT NO.:  [id number]   MEDICAL RECORD NO.:  [id number]          PATIENT TYPE:  INP   LOCATION:  2032                         FACILITY:  [facility]   PHYSICIAN:  [name], [name] [facility] OF BIRTH:  01/05/1924   DATE OF ADMISSION:  12/17/2005  DATE OF DISCHARGE:  12/25/2005                                 DISCHARGE SUMMARY   DISCHARGE DIAGNOSES:  1.  Admitted on June 14, via the office with progressive weakness, dyspnea,      dizziness and recent fall at home with injury to his left lower rib      cage.  Evaluation revealed failure to thrive at home due to multiple      medical problems as listed below.  Decision made for hospice home      services which was started upon discharge.  2.  Chronic obstructive pulmonary disease/asthmatic bronchitis with acute      exacerbation.  The patient treated with inhaled bronchodilators, low-      flow oxygen, mucolytic agents and intravenous Zithromax with      improvement.  The patient discharged on home oxygen and home nebulizer      via hospice.  He has history of Pseudomonas bronchitis in the past, but      no organisms specified this admission.  3.  Arteriosclerotic heart disease with a severe nonischemic dilated      cardiomyopathy and congestive heart failure.  He has an implanted      automatic implantable cardioverter/defibrillator and is on dobutamine      infusion by Dr. Gala Romney.  4.  History of nonobstructive coronary disease.  5.  History arteriosclerotic peripheral vascular disease with known renal      artery stenosis.  6.  History of hypertension, controlled on medications.  7.  History of atrial fibrillation on Coumadin followed in the Coumadin      clinic.  8.  History of hypercholesterolemia on Lipitor.  9.  History of renal insufficiency with known renal artery stenosis,      creatinine in the 2.6 range on dobutamine.  10. History of prostate cancer.  11. History of degenerative  arthritis and gout with a previous left total      knee replacement.  12. History gastroesophageal reflux disease.  13. History of diverticulosis.  14. History of marked debilitation and dependency in activities of daily      living as noted.   HISTORY OF PRESENT ILLNESS:  The patient is a 77 year old gentleman well  known to Dr. Gala Romney and myself with multiple medical problems as noted.  He was recently hospitalized from Nov 05, 2005, to Nov 23, 2005, by the  cardiology service and discharged on a dobutamine pump.  His creatinines had  been in the 3.5 range and improved to around 2.5 with this infusion.  He had  been seen back in the office on May 25, and June 8, by Dr. Gala Romney.  He  presented to the office on December 17, 2005, for an emergency work-in  appointment complaining of progressive weakness, increasing shortness of  breath, dizziness and  having fallen at home striking his left lower rib cage  on the commode with pain and tenderness in his left flank area.  He notes  low-grade fever around 100 with cough, chest congestion, small amount of the  sputum production.  Decision was made to rehospitalized the patient as he  was too weak for home care and the wife could not handle him in his current  state.   PAST MEDICAL HISTORY:  1.  History of severe COPD and asthmatic bronchitis with frequent      exacerbations.  He was last hospitalized by me in November 2005, please      see that discharge summary.  He is maintained at home on nebulizers,      Combivent and Mucinex.  2.  He has a history of severe nonischemic dilated cardiomyopathy with      congestive heart failure.  He has been followed regularly in the CHF      clinic especially since his hospitalization in May.  3.  He has nonobstructive coronary disease and arteriosclerotic peripheral      vascular disease with known renal artery stenosis.  He also has known      left internal carotid stenosis and is on Coumadin for this  has atrial      fibrillation.  4.  Hypertension, controlled on medications.  5.  He has atrial fibrillation and had an AICD placed and as noted,      controlled in the Coumadin clinic.  6.  History of hypercholesterolemia on Lipitor.  7.  He has had progressive renal insufficiency and sees Dr. Eliott Nine.  8.  Known renal artery stenosis and creatinines in the 3.5 range in May, but      these improved to about 2.5 on the dobutamine infusion.  9.  History of      prostate cancer with surgery in 1990.  9.  History of degenerative arthritis and gout.  10. Left total knee replacement in the past.  11. History of gastroesophageal reflux disease.  12. Diverticulosis.  13. Previous H.  pylori, but was intolerant to the amoxicillin and Flagyl      therapy previously.  14. As noted, he is markedly debilitated and has dependency to his daily      living as result of his cardiomyopathy.   ALLERGIES:  PENICILLIN.   PHYSICAL EXAMINATION:  GENERAL:  A 77 year old gentleman, chronically ill-  appearing in mild distress with left-sided rib pain.  VITAL SIGNS:  Blood pressure of 110/60, pulse 90 per minute and irregular,  respirations 22 per minute and not labored, temperature 98 degrees.  HEENT:  Exam was negative.  Could not see the fundi well.  NECK:  Neck exam showed jugular distension and a faint carotid bruit.  No  thyromegaly or lymphadenopathy.  CHEST:  Chest exam revealed decreased in breath sounds with bilateral  crackles at the bases and a few scattered rhonchi.  He had a small bruise in  his left flank area from his recent fall and this area was tender as well.  CARDIAC:  Cardiac exam revealed irregular rhythm, grade 1/6 systolic  ejection murmur at the left sternal border, S4 gallop.  No rubs.  ABDOMEN:  Abdomen was soft and nontender without evidence of organomegaly or  masses.  RECTAL:  Rectal was deferred.  EXTREMITIES:  Extremities showed some venous insufficiency, trace edema  and degenerative changes.  NEUROLOGIC:  Neurologic exam revealed to be diffusely weak, no focality.  SKIN:  Skin  exam showed the ecchymosis.   LABORATORY DATA:  EKG showed demand pacer, atrial fibrillation and right  bundle branch block pattern.  His last echocardiogram had ejection fraction  in 30% range.  Chest x-ray revealed underlying COPD, streaky right lower  lobe air space disease and atelectasis, mild interstitial edema.  His PICC  line in the right arm was changed to a Port-A-Cath just prior to his  discharge.  Serial films showed interval improvement and partial clearing of  the edema.   Hemoglobin 11.5, hematocrit 33.4, white count 10,900 with 8% segs.  Sedimentation rate was 89.  Protime 25.1, INR 2.3 on admission.  Sodium 141,  potassium 3.3, repeat 3.9, chloride 110, CO2 23, BUN 34, creatinine 2.9.  This improved to creatinine of 2.3 with therapy in the hospital.  Calcium  9.0, total protein 5.6, albumin 2.9, AST 18, ALT 12, Alk phos 91, total  bilirubin 0.6, magnesium 1.9.  CPK 86, negative MB and negative troponin.  TSH 3.64.  Sputum culture showed normal throat flora only.   HOSPITAL COURSE:  The patient was admitted from the office where he  presented with progressive weakness and a fall with injury to his left lower  ribs.  He was given rib binder and given pain medicine plus a heating pad.  His chest discomfort resolved with conservative management.  He had a mild  COPD exacerbation and was given IV Solu-Medrol, IV Zithromax, inhaled  bronchodilators and Mucinex.  This improved as well and his antibiotics were  discontinued and his Solu-Medrol changed back to his oral Medrol as at home.  He did well with the nebulizers and low-flow oxygen and due to his  progressive weakness in the face of maximum therapy for his severe ischemic  cardiomyopathy and severe underlying COPD, the decision was made to offer  hospice home services to the patient and his family.  After  discussion, they  agreed to hospice consultation and agreed to hospice services at discharge.   The patient was seen in consultation by Dr. Teena Dunk for cardiology.  He had  decreased the rate of infusion of his dobutamine from  2.5 mg/hour to 1.25  mg/hour during the hospital stay.  His renal function remained stable in the  2.3-2.6 range.  He was given IV Lasix during the hospital stay and  established a good negative balance.  He lost a considerable amount of fluid  during this hospital course and was switched to oral Lasix in an increased  dose currently getting 40 mg p.o. b.i.d..  As noted, his electrolytes and  renal function remained stable.   Blood sugars monitored by sliding scale during the hospital stay and  Coumadin was monitored by the Coumadin protocol.  He had difficulty with his  right decline and decision was made to place a Port-A-Cath just prior to  discharge for his continued dobutamine infusion.  He was felt to be MHB and ready for discharge on December 25, 2005.   MEDICATIONS AT DISCHARGE:  1.  Oxygen 2 L a minute by nasal cannula.  2.  Nebulizer treatments with Xopenex 1.25 mg and ipratropium 0.5 mg      nebulizer four times a day.  3.  Mucinex 600 mg tablets p.o. four times a day with plenty of water.  4.  Medrol 4 mg p.o. q.a.m.  5.  Coumadin as before with followup at the Coumadin clinic.  6.  Digoxin 0.125 mg p.o. on Monday, Wednesday and Friday.  7.  Amiodarone 200  mg p.o. daily.  8.  Dobutamine infusion pump set as per Dr. Gala Romney.  9.  Hydralazine 50 mg p.o. t.i.d.  10. Imdur 120 mg p.o. daily.  11. Lasix 40 mg p.o. b.i.d.  12. Lipitor 20 mg p.o. daily.  13. Senokot S 2 tablets p.o. nightly.  14. Calcitriol 0.25 mg p.o. daily.  15. Allopurinol 1 mg p.o. daily.  16. Prilosec 20 mg p.o. daily prior to dinner.   FOLLOW UP:  The patient will see Dr. Gala Romney in the Coumadin clinic next  week and follow up with me in early July.   CONDITION ON DISCHARGE:   Improved.      [name], [name] [facility]  Electronically Signed     SMN/MEDQ  D:  12/25/2005  T:  12/25/2005  Job:  [id number]   cc:   Arvilla Meres, [name] [facility]  [employer]  [address]  [place]  [place] [address]

## 2010-11-21 NOTE — Discharge Summary (Signed)
[facility]  Patient:    DOYT, CASTELLANA                    MRN: [id number] Adm. Date:  [id number] Disc. Date: [id number] Attending:  Veneda Melter Dictator:   Joellyn Rued, P.A.-C. CC:         [name], [name] [facility]  Aram Beecham B. Eliott Nine, [name]   Discharge Summary  DATE OF BIRTH:  [birth year]  HISTORY OF PRESENT ILLNESS:  Mr. Cranston is a 77 year old white male with a history of bilateral renal artery stenosis.  He is admitted for stenting by Dr. Veneda Melter.  PAST MEDICAL HISTORY:  1. He also has a history significant for renal insufficiency, followed by     Dr. Aram Beecham B. Dunham.  2. Heart failure.  3. Atherosclerotic disease of the aorta.  4. History of chronic atrial fibrillation.  5. Chronic obstructive pulmonary disease.  6. Hyperlipidemia.  7. A cardiac catheterization on September 09, 2000, showed normal LV function,     30% and 50% lesions of the LAD, with moderate pulmonary hypertension.  8. History of prostate cancer.  9. Moderate left ICA stenosis. 10. Bradycardia secondary to amiodarone and Verapamil. 11. Coumadin therapy secondary to atrial fibrillation. 12. Pruritic rash, of uncertain etiology. 13. Diabetes mellitus.  LABORATORY DATA:  Sodium 140, potassium 3.6, BUN 46, creatinine 2.4, glucose 131 on admission.  Subsequent chemistries were essentially unremarkable.  H&H 13.5 and 39.5, RDW slightly elevated at 14.2, platelets 233, WBC 7.5.  PT 14.0, INR 1.2, PTT 29.  Urine culture showed insignificant growth. Respiratory culture showed moderate Pseudomonas aeruginosa.  A chest x-ray showed stable changes of chronic obstructive pulmonary disease, no acute findings.  HOSPITAL COURSE:  Mr. Shehadeh was admitted to the hospital for a planned renal angiogram with possible intervention.  Overnight he did not have any complaints.  His potassium was supplemented.  On December 29, 2000, Dr. Chales Abrahams performed a renal angiogram.  This  revealed a single left renal artery, with a proximal 50% lesion, without gradient.  The right superior renal artery was a small vessel, with a 40% to 50% midlesion without gradient.  The inferior right renal artery was a small vessel without significant disease.  The right kidney was noted to have slow flow.  Dr. Chales Abrahams after reviewing, felt that he had moderate renal stenosis, and that he should continue medical therapy. Post-sheath removal and bedrest, he was ambulating without difficulty.  The catheterization site was intact.  It was noted that his rash was improving on steroids.  His creatinine was still slightly elevated post-procedure, thus he was hydrated.  By December 31, 2000, Dr. Chales Abrahams felt that he could be discharged home.  DISCHARGE DIAGNOSES: 1. Renal artery disease. 2. Chronic renal insufficiency. 3. Positive sputum cultures. 4. History as previously.  DISPOSITION:  He is discharged home.  DISCHARGE MEDICATIONS:  1. He is asked to resume his Coumadin 7.5 mg q.d., and 5 mg on Monday.  2. Keflex 500 mg b.i.d.  3. Actos 15 mg q.d.  4. Humibid LA 600 mg, two tablets b.i.d.  5. K-Dur 10 mEq q.d.  6. Zyrtec 10 mg q.d.  7. Lipitor 10 mg q.h.s.  8. Doxepin 25 mg q.d.  9. Nexium 40 mg q.d. 10. Combivent two puffs q.i.d. 11. Flovent two puffs b.i.d. 12. Calcitriol 0.25 mg two tablets q.d. 13. Multivitamin q.d. 14. Lasix 160 mg b.i.d.  INSTRUCTIONS:  He was advised no lifting, driving,  sexual activity, or heavy exertion x one day.  If he has any problems with his catheterization site, to please call.  DIET:  To maintain a low-salt, low-fat, low-cholesterol diet.  FOLLOWUP:  He will have a PT and INR drawn on Wednesday, January 05, 2001.  He will have a groin check on Friday, January 14, 2001, at 3:30 p.m.  He is to see Dr. Chales Abrahams in three months with a BMP with Dr. [name] as needed. DD:  01/21/01 TD:  01/21/01 Job: [id number]

## 2010-11-21 NOTE — H&P (Signed)
NAME:  QUANTA, ROHER NO.:  [id number]   MEDICAL RECORD NO.:  [id number]          PATIENT TYPE:  INP   LOCATION:  2014                         FACILITY:  [facility]   PHYSICIAN:  Arvilla Meres, [name] LHCDATE OF BIRTH:  28-Jul-1923   DATE OF ADMISSION:  11/05/2005  DATE OF DISCHARGE:                                HISTORY & PHYSICAL   CHIEF COMPLAINT:  Failure to thrive, worsening of congestive heart failure  with symptomatology.   HISTORY OF PRESENT ILLNESS:  Mr. Knight is a 77 year old gentleman,  patient well known to [employer].  He has a history of non-obstructive  coronary artery disease on cardiac catheterization in 2002, and a long  history of non-ischemic cardiomyopathy.  Last ejection fraction was 30% in  November 2005.  The patient has concomitant renal insufficiency and is  followed by Dr. Camille Bal with recent renal angiogram negative for  interventional opportunity.  Mr. Jarriel was evaluated in the Heart Failure  Clinic today and was found to have to worsening symptoms since discharge  post-procedure associated with weakness, fatigue, and inability to complete  daily activities.   PAST MEDICAL HISTORY:  1.  Non-ischemic cardiomyopathy.      1.  EF of 30% on echocardiogram in November 2005.      2.  Status post placement of Guidant bi-ventricular ICD.  2.  History of bilateral renal artery stenosis as evaluated by ultrasound.      1.  Status post recent renal angiogram by Dr. Geralynn Rile on October 23, 2005, which revealed distal pruning without high grade proximal          stenosis. 3.  Non-obstructive coronary artery disease.      2.  Cardiac catheterization in 2005, revealed 20% left main, 40%          proximal at LAD, 30% distal LAD, normal circumflex, normal RCA.  3.  History of left pneumothorax at the time of Guidant bi-v ICD placement.  4.  History of bilateral renal artery stenosis with delineation as noted      above.  5.  History of COPD, maintained on inhalers.  6.  History of permanent atrial fibrillation.      1.  Maintained on chronic Coumadin therapy, followed by [facility].      2.  Most current dosing 2.5 mg p.o. daily except 3.75 mg p.o. on          Mondays, Wednesdays, and Fridays.  7.  Hyperlipidemia.  8.  History of prostate cancer.  9.  History of gastroesophageal reflux disease.  10. History of hypoparathyroidism.  11. History of gout.   CURRENT MEDICATIONS:  1.  ___________80 mg p.o. daily.  2.  Digoxin 0.0625 mg daily.  3.  Coreg 6.25 mg b.i.d.  4.  Warfarin as directed.  5.  Omeprazole 20 mg p.o. b.i.d.  6.  AeroBid two puffs inhaled b.i.d.  7.  Albuterol and Atrovent q.i.d.  8.  Guaifenesin 1200 mg b.i.d.  9.  Sulfamethoxazole Trimethoprim 10 days on, 10 days off for suppression of      lung infection.  10. Furosemide 80 mg q.a.m., 40 mg in the afternoon.   P.R.N. MEDICATIONS:  1.  Tramadol 50 mg one tablet p.o. q.8h. p.r.n. pain.  2.  Combivent one to two puffs q.4-6h. p.r.n.   ALLERGIES:  PENICILLIN.   REVIEW OF SYSTEMS:  As stated in the HPI, and otherwise negative.  The  patient denies fevers, chills, cough, hemoptysis, melena, hematochezia,  hematuria, voice changes, or skin changes.  He feels that he is having  swelling in his hands and face, though his weights are down.   PHYSICAL EXAMINATION:  GENERAL:  A chronically ill-appearing well-developed  male who appears extremely fatigued.  VITAL SIGNS:  Weight today is 160.5 pounds, blood pressure is 108/66 in the  office, heart rate is 69, respiratory rate is 18.  HEENT:  Pupils equal, round, reactive to light.  Fundi are not visualized.  Oral and nasal mucosa are moist.  NECK:  Supple with no JVD at 45 degrees.  Wave form is within normal limits.  No thyromegaly is appreciated.  Trachea is at midline.  CHEST:  Unremarkable with well-healed ICD scar without drainage or exudate.  BACK:   No CVA tenderness.  No kyphosis, scoliosis.  LUNGS:  Clear to auscultation without wheeze in all lung fields bilaterally.  CARDIOVASCULAR:  Normal S1 and S2.  Positive S3, no S4.  No obvious murmurs.  ABDOMEN:  Somewhat distended, soft, nontender, without hepatosplenomegaly.  The liver edge is not palpable.  EXTREMITIES:  Trace to 1+ lower extremity edema bilaterally to mid calf.  NEUROLOGIC:  The patient is alert and oriented to person, place, and time.  He answers questions appropriately.  He participates in the visit.  Cranial  nerves are grossly intact.  Neuromotor is grossly intact.   ASSESSMENT:  The patient has a history of non-ischemic cardiomyopathy, is  seen in the Heart Failure Clinic and has symptoms concerning for low flow  state or low output state.  The patient has had significant fatigue and  symptomatology in the past week which has worsened, and it is evident that  he is unable to complete activities without significant shortness of breath  currently.   PLAN:  1.  The patient will be admitted.  A right heart cath will be undertaken to      assess the patient's filling pressures and cardiac output to see if his      fatigue is primarily cardiac. If low output, consider shortt course of      inotropic support.  2.  It is of note, the patient's INR in the office today is 2.  Labs will be      obtained in the morning and Coumadin      will be held today to allow appropriate drift in the INR so that right      heart cath can be undertaken.  Followup is as labs are available and as      indicated per standard protocol.   TIME SPENT WITH PATIENT ON ADMISSION IN THE OFFICE:  Greater than one hour.     ______________________________  Shelby Dubin, Pharm.D, DCPS, CTP      Arvilla Meres, [name] [facility]  Electronically Signed    MP/MEDQ  D:  11/05/2005  T:  11/05/2005  Job:  [id number]  cc:   [name], [name] [facility]  [address]  [place]  [place] [address]  [facility] Eliott Nine, [name]  Fax: [phone]   Salvadore Farber, [name] [facility]  [address]  [address]  [place]  [place] [address]

## 2010-11-21 NOTE — Assessment & Plan Note (Signed)
[facility]                          CHRONIC HEART FAILURE NOTE   NAME:Thomann, CHRISTIPHER RIEGER                    MRN:          [id number]  DATE:07/28/2006                            DOB:          [birth year]    Mr. Crocket returns today for followup regarding his end-stage heart  failure with poor response to dobutamine therapy, which I just increased  last week from 1.25 to 2.5 microgram per kilogram per minute. Mr.  Haque is under the care of hospice. Today, he states that he cannot  tell any improvement in his symptoms. He continues to complain of  ongoing fatigue and shortness of breath. He is also complaining of some  abdominal fullness and decreased appetite.   PAST MEDICAL HISTORY:  1. Congestive heart failure secondary to non-ischemic cardiomyopathy      with an EF of 30%. Status post implantation of a biventricular      defibrillator. The patient is being maintained on dobutamine drip      under the care of hospice.  2. Atrial fibrillation, rate controlled.  3. Chronic renal insufficiency.  4. DO NOT RESUSCITATE (DNR) status.  5. Diabetes.   REVIEW OF SYSTEMS:  As stated above.   CURRENT MEDICATIONS:  1. Amaryl 4 mg daily.  2. Metformin 500 daily.  3. Magnesium 400 mg daily.  4. Klor-Con 20 mEq daily.  5. Coumadin per Coumadin Clinic.  6. Digoxin 0.125 on Monday, Wednesdays and Fridays.  7. Lasix 40 mg b.i.d.  8. Hydralazine 50 t.i.d.  9. Lipitor 20.  10.Methylprednisolone 4.  11.Isosorbide 120.  12.Calcitriol every other day.  13.Allopurinol 100 mg p.r.n.  14.Prilosec daily.  15.Dobutamine drip 2.5 micrograms per kilogram per minute.  16.Albuterol, Atrovent nebs as instructed.   PHYSICAL EXAMINATION:  No acute distress.  JVD at 8-10 cm at a 45 degree angle.  LUNGS:  Clear to auscultation.  CARDIOVASCULAR: S1, S2, positive S3.  ABDOMEN: Positive bowel sounds. Slightly distended.  LOWER EXTREMITIES: He has +2 pitting edema  left lower extremity and +1  to right.   IMPRESSION:  Mild volume overload at this time. Zaroxolyn 2.5 mg x3  days. Continue Lasix at 40 mg b.i.d. Continue dobutamine and see patient  back in 1 week.      Dorian Pod, ACNP  Electronically Signed      Bevelyn Buckles. Bensimhon, MD  Electronically Signed   MB/MedQ  DD: 07/28/2006  DT: 07/28/2006  Job #: [id number]   cc:   [name], MD

## 2010-11-21 NOTE — H&P (Signed)
[name], [name] Day NO.:  [id number]   MEDICAL RECORD NO.:  [id number]          PATIENT TYPE:  INP   LOCATION:  1610                         FACILITY:  [facility]   PHYSICIAN:  [name] Day, [name]    DATE OF BIRTH:  06-18-1924   DATE OF ADMISSION:  05/08/2004  DATE OF DISCHARGE:                                HISTORY & PHYSICAL   IDENTIFICATION:  Patient is a 77 year old with known coronary artery  disease, decreased LV function, atrial fibrillation.  He was admitted  yesterday for weakness, failure to thrive.   HISTORY OF PRESENT ILLNESS:  Patient said all summer he has had problems  with diarrhea.  Recently evaluated by Dr. Victorino Day.  By report, it  appears that he has H. pylori, although full records are not available, and  he was treated with antibiotics.  Since treatment started, he has become  progressively weak with decreased appetite, increased shortness of breath,  orthopnea, PND, increased edema, more than his usual.  He denies any chest  pain.  No nitroglycerin use.  Notes a weight loss, 20 pounds, since May,  2005.  Wife has been pushing fluids with his decreased appetite and  diarrhea.   Since admission, intake has been greater than output without any improvement  in his symptoms.  Also, atrial fibrillation with controlled ventricular  response, now increased.   ALLERGIES:  PENICILLIN.   PAST MEDICAL HISTORY:  1.  CAD/CHF:  Last cardiac catheterization in June, 2003.  Underwent CABG in      December, 2001 with LIMA to LAD, left radial to OM, SVG to diagonal.      Catheterization report not available at present.  2.  CHF with last echocardiogram in 2004.  EF of 35-45%.  Last Adenosine      Cardiolite in June, 2004 showed no ischemia.  3.  Chronic atrial fibrillation, on Coumadin therapy.  4.  Cerebrovascular disease with borderline carotid-occlusive disease, being      followed by Dr. Hart Day.  5.  COPD with bullous emphysema.  6.  Pulmonary  hypertension.  7.  Hypertension.  8.  Chronic renal insufficiency with baseline creatinine of 2.  Followed by      Dr. Eliott Day.  9.  Dyslipidemia.  Last lipid panel in January, 2005 showed a total      cholesterol of 150, LDL of 60, triglycerides 80, HDL 49.  10. History of GE reflux, hiatal hernia, H. pylori.  11. History of diverticulosis, colon polyps.  12. DJD.  13. Non-insulin-dependent diabetes.   MEDICATIONS PRIOR TO ADMISSION:  1.  Coumadin as directed.  2.  Combivent inhaler q.i.d.  3.  Flovent b.i.d.  4.  __________ 0.25 2 q.d.  5.  Niacin 1 gm q.h.s.  6.  K-Dur 10 mEq q.d.  7.  Guaifenesin 600 b.i.d.  8.  Zocor 40 q.h.s.  9.  Glipizide 5 b.i.d.  10. Nexium 40 q.d.  11. Lasix 80 q.d.  12. Aspirin 81 mg q.d.   IN-HOSPITAL MEDICATIONS:  1.  IV normal saline.  2.  Albuterol.  3.  Aspirin 81 mg  q.h.s.  4.  Calcitrol.  5.  Lasix 80 q.d.  6.  Glipizide 5 b.i.d.  7.  Guaifenesin 600 b.i.d.  8.  Sliding-scale insulin.  9.  Atrovent q.6h.  10. Xopenex q.6h.  11. Niacin 1 gm q.h.s.  12. Protonix 40 b.i.d.  13. Potassium 10 mEq  14. Zocor 40 q.h.s.  15. IV Cardizem.   SOCIAL HISTORY:  The patient is married and lives with his wife.  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  He has three children.  Quit smoking for  five days.  Prior to that, one pack per three days.  No alcohol.   FAMILY HISTORY:  Mother died in her 78s of colon cancer.  Father died in his  72s of prostate cancer.   REVIEW OF SYSTEMS:  Weight loss as noted.  No fever, chills.  HEENT:  Nasal  discharge.  Wear glasses.  SKIN:  Bruises easily.  CARDIOPULMONARY:  As  noted.  GU:  Hesitancy.  NEURO:  Slight weakness.  MUSCULOSKELETAL:  Hip  arthralgias.  GI:  Nausea, vomiting, and diarrhea, as noted.  GU:  Reflux,  as noted.  Odynophagia, dysphagia, diarrhea.  ENDOCRINE:  As noted.   PHYSICAL EXAMINATION:  VITAL SIGNS:  Blood pressure 104/67, pulse 130s and  irregular, temperature 98.5.  O2 sat on 2  liters is 92%.  GENERAL:  Patient is in mild distress.  NECK:  JVP is increased to the level of the jaw.  LUNGS:  Decreased breath sounds at the bases.  Crackles one-third up with  wheezes.  HEART:  Irregularly irregular.  S1 and S2.  No definite S3.  ABDOMEN:  Diffusely tender with increased right upper quadrant tenderness  and hepatomegaly.  EXTREMITIES:  Edema 1-2+.  NEUROLOGIC:  Alert and oriented x 3.  Cranial nerves II-XII intact.  SKIN:  Some bruises.   CHEST X-RAY:  CHF and probable small bilateral pleural effusions.   EKG shows atrial fibrillation, rate in the 150s, left bundle branch block.   LABS:  Significant for a hemoglobin of 13.3, BUN and creatinine of 37 and  2.1, potassium of 3.8.  WBC 11.5, CK-MB 100/14.4, troponin 0.08.        WBSR  at 32.  BNP 2950.   IMPRESSION:  [name] is a 77 year old with multiple medical problems,  including coronary artery disease and discrete left ventricular function.  He also has chronic atrial fibrillation.  EKG shows an uncontrolled rate  with a left bundle branch block (question age).  On examination, again, he  is in decompensated heart failure.  Echocardiogram today shows an LVF in the  25-30% range.  This may actually improve once he is diuresed.  It will be  difficult to control the rate until his fluid status is improved.   Would recommend for now IV Lasix. Will give 100 mg IV and record urine  output.  Continue current dose for now.  Check TSH.  Will need to get an old  EKG to compare for left bundle branch block.  Would repeat CK-MB and  troponin.  Will continue to follow with you.      PVR/MEDQ  D:  05/09/2004  T:  05/09/2004  Job:  [id number]

## 2010-11-21 NOTE — Op Note (Signed)
[name], [name] Day NO.:  [id number]   MEDICAL RECORD NO.:  [id number]            PATIENT TYPE:   LOCATION:                                 FACILITY:   PHYSICIAN:  Arvilla Meres, [name] [facility]  DATE OF BIRTH:   DATE OF PROCEDURE:  11/20/2005  DATE OF DISCHARGE:                                 OPERATIVE REPORT   A procedure note on direct current cardioversion.   [name] is a 77 year old male with history of end-stage congestive  heart failure, as well as advanced COPD and chronic renal insufficiency.  He  was admitted with low-output heart failure and has been placed on dobutamine  drip.  He is proven to be inotropic dependent.  He also has a long history  of chronic atrial fibrillation; however, given his low-output state, we have  loaded him on amiodarone in an attempt to get him back into sinus rhythm.  He has been maintained on Coumadin, long term, and while in the hospital he  has been switched over to heparin and now back to Coumadin with therapeutic  INR of 2.1 today.   DESCRIPTION OF PROCEDURE:  The risks and benefits of cardioversion was  discussed with [name].  Consent was signed and placed on the chart.   Sedation.  Was provided by anesthesia with 120 mg of Pentothal.   Cardioversion.  He received a single biphasic 150-joule shock which appeared  to convert him into sinus rhythm versus atrial flutter.  Interrogation of  his device showed atrial flutter.  Burst pacing thorugh his device was  attempted and this put him back in atrial fibrillation.   The plan will be to continue amiodarone load for over the weekend and plan  repeat cardioversion on Monday morning.      Arvilla Meres, [name] [facility]  Electronically Signed     DB/MEDQ  D:  11/20/2005  T:  11/20/2005  Job:  [id number]

## 2010-11-21 NOTE — Assessment & Plan Note (Signed)
[facility]                   COUMADIN / CHRONIC HEART FAILURE CLINIC NOTE   NAME:[name] Day, [name] Day                      MRN:          [id number]  DATE:03/09/2006                            DOB:          1924-01-18    Mr. Glantz returns today for further evaluation and medication titration  of his end-stage heart failure secondary to nonischemic cardiomyopathy with  EF of 25-30%, currently being maintained on home dobutamine drip through  Hospice care.  Mr. Tippin also has severe chronic obstructive pulmonary  disease.  Mr. Bee states he is feeling good today.  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  His wife states he  did real well in the wheelchair, was rather tired when he got home, but  otherwise had a really good day.  He continues to be followed by Hospice.  Recent blood work sent to me from Hospice showed a potassium of 3.8, BUN of  47, and creatinine 2.41.  This is an improvement from blood work done in  July with a BUN of 53 and a creatinine 3.4.  Mr. Lembcke has a known  history of renal insufficiency with a baseline creatinine around 3 in the  past.  He has denied any orthopnea or PND.  He has complained of some mild  abdominal distention but does not feel that it is fluid.  It is most likely  secondary to improved appetite.  He also states he has some mild peripheral  edema to his left lower extremities, otherwise appears stable.   PAST MEDICAL HISTORY:  1. End-stage heart failure secondary to severe nonischemic cardiomyopathy      with an EF of 25-30%, being maintained on home dobutamine care through      Hospice.  2. Nonobstructive coronary artery disease.  3. Atrial fibrillation.  4. Renal insufficiency.  5. Severe COPD.   REVIEW OF SYSTEMS:  As stated above in history of present illness, otherwise   negative.   CURRENT MEDICATIONS:  1. Digoxin 0.125 mg on Mondays, Wednesdays, and Fridays.  2. Lasix 40 mg b.i.d.  3. Hydralazine 50 mg t.i.d.  4. Lipitor 20 mg daily.  5. Methylprednisolone 4 mg daily.  6. Isosorbide 120 mg daily.  7. Coumadin as directed.  8. Calcitrol 0.25 mg every other day.  9. Guaifenesin two tablets b.i.d.  10.Allopurinol 100 mg daily.  11.Prilosec 20 daily.  12.Dobutamine by continuous infusion.  13.Senokot two tablets q.h.s.  14.Albuterol nebulizers and Atrovent nebulizers q.i.d.  15.Combivent 1-2 puffs q.4 p.r.n.  16.Claritin 10 mg p.r.n.  17.Tramadol 50 mg p.r.n.   PHYSICAL EXAMINATION:  VITAL SIGNS:  Weight 176, blood pressure 136/61 with  a pulse of 69.  No weight fluctuation, however, the patient weighs with the  dobutamine pouch included in his weight.  GENERAL:  The patient is in no acute distress, very pleasant and  cooperative, 77 year old Caucasian gentleman.  NECK:  Supple without lymphadenopathy.  No JVD.  CARDIAC:  Distant heart sounds.  Positive S3.  LUNGS:  Clear to auscultation bilaterally.  ABDOMEN:  Soft, nontender.  Slightly distended.  Good bowel sounds.  EXTREMITIES:  Without clubbing, cyanosis, or edema to the right lower  extremity.  The patient has +1 edema to the left lower extremity.  NEUROLOGIC:  The patient is alert and oriented x3.  Cranial nerves II-XII  grossly intact.   IMPRESSION:  End-stage congestive heart failure secondary to nonischemic  cardiomyopathy being maintained on dobutamine infusion and followed by  Hospice care.  Will continue current medical regimen.  See patient back in 6-  8 weeks.  The patient has my cell phone number if he needs to get in touch  with me or if the Hospice nurses need to call me.                                 Dorian Pod, ACNP    MB/MedQ  DD:  03/09/2006  DT:  03/10/2006  Job #:  [id number]

## 2010-11-21 NOTE — Assessment & Plan Note (Signed)
[facility]                              CARDIOLOGY OFFICE NOTE   NAME:[name] Day, [name] Day                    MRN:          [id number]  DATE:02/05/2006                            DOB:          1924-06-13    PRIMARY CARE PHYSICIAN:  [name], [name]   NEPHROLOGIST:  Duke Salvia. Eliott Nine, [name]   PATIENT IDENTIFICATION:  [name] is a delightful 77 year old male with  end-stage heart failure on hospice care who returns for routine followup.   PROBLEM LIST:  1.  End-stage heart failure secondary to severe non-ischemic cardiomyopathy,      ejection fraction of 25 to 30%.      1.  On hospice with home dobutamine.  2.  Non-obstructive coronary disease.  3.  Atrial fibrillation.  4.  Renal insufficiency with baseline creatinine about 3.  5.  Severe chronic obstructive pulmonary disease with recent pneumonia.   CURRENT MEDICATIONS:  1.  Dobutamine by continuous infusion.  2.  Digoxin 0.125 mg every Monday, Wednesday, and Friday.  3.  Lasix 40 mg b.i.d.  4.  Hydralazine 50 mg t.i.d.  5.  Lipitor 20 mg.  6.  Prednisone 4 mg daily.  7.  Imdur 120 mg.  8.  Warfarin as directed.  9.  Allopurinol 100 mg daily.  10. Prilosec 20 mg daily.  11. Albuterol nebs.  12. Atrovent nebs.   ALLERGIES:  PENICILLIN.   INTERVAL HISTORY:  [name] returns today with his wife for routine  followup.  He has had the opportunity to go to [facility] to see his old  b[place] and that has really rejuvenated him.  He says he has felt better  than he has in a long time.  He denies any significant chest pain or  shortness of breath.  He has had just minimal lower extremity edema.  Overall, he is really doing quite well.   PHYSICAL EXAMINATION:  GENERAL:  Elderly, weak-appearing, but appears better  than usual today.  VITAL SIGNS:  Blood pressure is 120/74, heart rate 72.  His weight is 169.  HEENT:  Sclera nonicteric.  EOMI.  There is no xanthomatous.  Mucous  membranes are moist.  NECK:  Supple, no JVD.  Carotids are 2+ bilaterally.  CARDIAC:  Distant heart sounds, but he is regular.  No murmur, +S3.  LUNGS:  Clear with a prolonged expiratory phase.  ABDOMEN:  Soft, nontender, nondistended.  Good bowel sounds.  EXTREMITIES:  Trace edema.  NEUROLOGIC:  He is alert and oriented x3.  Very pleasant affect.  Cranial  nerves are intact.   ASSESSMENT AND PLAN:  End-stage congestive heart failure secondary to non-  ischemic cardiomyopathy, on home dobutamine.  He is doing quite well  currently.  We will not make any changes in his  medical regimen and  continue hospice care.  I encouraged him to continue to get out as often as  possible on day trips to help improve his mood.  We will see him back in  clinic in four to five weeks for routine followup.  Bevelyn Buckles. Bensimhon, MD    DRB/MedQ  DD:  02/05/2006  DT:  02/05/2006  Job #:  [id number]   cc:   Duke Salvia. Eliott Nine, MD

## 2010-11-21 NOTE — Discharge Summary (Signed)
[name], [name] Day                       ACCOUNT NO.:  [id number]   MEDICAL RECORD NO.:  [id number]                   PATIENT TYPE:  INP   LOCATION:  2017                                 FACILITY:  [facility]   PHYSICIAN:  [name], [name] [facility]            DATE OF BIRTH:  [birth year]   DATE OF ADMISSION:  12/27/2002  DATE OF DISCHARGE:  01/06/2003                                 DISCHARGE SUMMARY   FINAL DIAGNOSES:  1. Admitted on December 27, 2002 with acute bright red hemoptysis.  Patient on     Coumadin because of chronic atrial fibrillation and prothrombin time 32.1     seconds with INR 4.1 on admission.  Evaluation revealed bullous emphysema     with opacified bleb at the left base--hemorrhage into bleb versus     possible fungus ball.  2. Chronic obstructive pulmonary disease and continued smoker.  3. Arteriosclerotic heart disease and chronic atrial fibrillation.  4. Arteriosclerotic peripheral vascular disease with known left carotid     disease and bilateral renal artery stenosis--status post percutaneous     transluminal coronary angioplasty and stents.  5. History of hypertension and renal insufficiency with creatinine around 2.  6. History of hyperlipidemia.  7. Diabetes--controlled on oral agents.  8. History of prostate cancer, previous transurethral resection of prostate     in 1990 by Dr. Wilson Singer.  9. History of gastroesophageal reflux disease.  10.      History of diverticulosis and colon polyps with positive family     history of colon cancer.  11.      History of degenerative arthritis with previous left total hip     replacement.  12.      ALLERGY TO PENICILLIN.   BRIEF HISTORY AND PHYSICAL:  The patient is a 77 year old white man known to  me from previous hospital and office evaluations.  He is followed by Dr. Bonnee Quin for cardiology and Dr. Camille Bal for nephrology.  He presented  to the emergency room on December 27, 2002 with sudden onset of bright  red  hemoptysis on the morning of admission.  He denied fevers, chills or sweats,  and there was no chest pain.  He had sudden hemoptysis with no prior  history.  He is on Coumadin for atrial fibrillation followed in the Coumadin  Clinic.  His last prothrombin time was June 7 and was due for a follow-up in  one month.  He was taking 7.5 mg of Coumadin daily except 1/2 pill on  Wednesday and Saturday.  He had recently been started on Bextra because of  right shoulder pain.  He has known COPD and has continued smoking despite  all efforts to get him to quit.  His last chest x-ray was February 2004.  This showed COPD, mild scarring and no acute changes.  Review of old CT  chest showed a moderate sized bleb at the  left base.  Previous pulmonary  function studies showed severe obstructive defect with decrease in diffusion  capacity compatible with emphysema.   PAST MEDICAL HISTORY:  1. COPD and continued smoking--as above.  2. Arteriosclerotic heart disease and chronic atrial fibrillation.  He is on     Coumadin and followed in the Coumadin Clinic.  His last catheterization     in 2002 showed a 30 to 50% LAD lesion and luminal irregularities in the     other two vessels.  He had normal LV function.  There was moderate     pulmonary hypertension and mitral regurge.  He had a history of     bradycardia on amiodarone and verapamil in the past.  3. Arteriosclerotic peripheral vascular disease.  He has a history of     moderate left carotid disease with a 60 to 79% stenosis on his last     carotid Doppler.  He has known bilateral renal artery stenosis and had     previous PTA and stents placed.  4. Hypertension and renal insufficiency.  He has moderate hypertension and     renal insufficiency with creatinines around 2.  He has been evaluated by     Dr. Camille Bal for nephrology.  As noted, he had renal artery     stenosis and renal PTA and stents last year.  5. Hyperlipidemia.  This is  controlled on Zocor and Niacin.  6. Diabetes.  This is controlled with Glipizide and Actos.  7. History of prostate cancer with previous TURP in 1990 by Dr. Wilson Singer.  8. History of gastroesophageal reflux disease.  He takes Aciphex for this.  9. History of diverticulosis and colon polyps with a positive family history     of colon cancer.  10.      History of degenerative arthritis with previous left total hip     replacement.   PHYSICAL EXAMINATION:  GENERAL:  Physical examination at the time of  admission revealed an elderly white man in no acute distress.  There was  obvious bright red hemoptysis of a moderate amount.  VITAL SIGNS:  Blood pressure 160/70, pulse 84 and regular, respirations 20  per minute and not labored, O2 sat was 97% on room air, temperature 98.8.  HEENT:  Exam was unremarkable except for blood in the mouth.  NECK:  Exam showed no jugular venous distention, faint bilateral carotid  bruits.  No thyromegaly or lymphadenopathy.  CHEST:  Exam revealed rhonchi at the left base, decreased breath sounds but  clear on the right.  CARDIAC:  Exam revealed irregular rhythm with grade 1/6 systolic ejection  murmur at the left sternal border.  No rubs or gallops heard.  ABDOMEN:  Soft and nontender without evidence of organomegaly or masses.  EXTREMITIES:  Showed no clubbing, cyanosis or edema.  NEUROLOGIC:  Exam was intact without focal abnormalities detected.   LABORATORY DATA:  EKG showed atrial fibrillation with some aberrantly  conducted complexes.  There was left bundle branch block and no acute  abnormalities.  A 2-D echo showed overall left ventricular systolic function  was moderately reduced with an ejection fraction in the 35 to 45% range;  there was akinesis of the entire septal wall; moderate mitral valvular  regurgitation; moderate left atrial dilatation; the right atrium was dilated as well; estimated peak systolic artery pressure was moderately increased at   around 40.  Adenosine Cardiolite scan showed no signs of pharmacologic  stress induced ischemia.  There was inferior  and septal fixed perfusion  defect suggesting scar.  Chest x-ray showed COPD and a rounded mass like  density at the left base medially.  There was some associated left lower  lobe atelectasis.  CT of the chest showed a large bleb at the left lower  lobe that was now completely healed with soft tissue density compared to the  previous scan of April 01, 2001.  This was felt to represent either  bleeding into the bleb or a fungus ball.  There appeared to be some  associated left lower lobe bronchiectasis and atelectatic changes.  No  adenopathy.  A follow-up scan showed similar appearance with perhaps some  slightly increased lucency in this filled bleb structure that measured about  6 cm.  Bronchoscopy this admission revealed old blood coming from the medial  basal segment of the left lower lobe, no endobronchial lesions, no other  abnormalities detected.  Pulmonary function studies showed FVC of 3.25  liters (80% of predicted); FEV1 1.37 liters (44% of predicted); and FEV1/FVC  ratio of 42%.  After bronchodilator, FEV1 improved to 1.53 liters.  Lung  volumes showed reduction in total lung capacity and residual volume and  diffusion capacity was only 46% of the predicted normal, indicating  emphysema.  Hemoglobin 14.1, hematocrit 41, white count 21,800 with 91% segs  on admission.  Serial CBCs were followed throughout his hospital course.  Hemoglobin was 13.1 prior to discharge.  Sed rate 30.  Prothrombin time 32.1  with INR of 4.1 on admission.  Coumadin was discontinued and serial  prothrombin times were followed down to normal range.  Sodium 138, potassium  4.1, chloride 107, CO2 20, BUN 33, creatinine 2.1, blood sugar 162, calcium  9.1, total protein 6.8, albumin 3.5, AST 29, ALT 8, alk phos 59, total  bilirubin 1, hemoglobin A1c 6.3, CPK 76 with negative MB and  negative  troponin, beta natriuretic peptide 788, TSH 1.1, PSA less than 0.02, CK  level 2.4.  Sputum culture, normal throat flora and negative AFB and  negative fungi on smears.   HOSPITAL COURSE:  The patient was admitted with bright red hemoptysis and  chest x-ray showing an abnormality in the left lower lobe.  CT scan compared  to the previous scan in September 2002 showed a previous empty bleb at the  left base which was now totally filled with a radio-opaque material believed  to be either blood or possibly a fungus ball.  His Coumadin was held and he  was given a small dose of vitamin K.  Serial prothrombin times were followed  with resolution of the coagulopathy.  The bright red blood abated and was  replaced with a dark maroon blood as the area in the left base started to  slowly shell out.  A consultation was requested from the CVTS service and the patient was seen by Dr. Tyrone Sage.  He was also seen by cardiology, Dr.  Riley Kill, et al, attending the patient during his hospitalization.  There was  considerable discussion about how to proceed in Mr. Berne's case.  It was  realized that he needs to be on his anticoagulation because of his atrial  fibrillation and cardiovascular disease but that bleeding from this lesion  was the late limiting problem at present.  Due to his severe emphysema and  cardiovascular disease, Dr. Tyrone Sage was reluctant to proceed with surgical  intervention at the present time.  Conservative approach was decided upon  and the patient received nebulizer treatments, gentle  chest physiotherapy,  mucolytic agents and antibiotics with Avelox.  He improved slowly and the  decision was made for discharge with outpatient follow-up with x-rays and  repeat CT scan in 3-4 weeks.   The patient's other medical problems were followed during the hospital stay.  Blood sugars were monitored and all remained in the 120 to 160 range on his  oral agents.  Blood pressure  as well was controlled.  He was attended by the  cardiology service who did a 2-D echo and Cardiolite as above.  His  medications were continued without change during this hospitalization except  for the discontinuation of the Coumadin.  The patient had his 79th birthday  on December 30, 2000.   MEDICATIONS AT DISCHARGE:  1. Nebulizer treatments with Duoneb albuterol 2.5 plus Atrovent 0.5 q.i.d.  2. Postural drainage treatments to drain left lower lobe twice a day.  3. Humibid LA 2 tablets p.o. b.i.d. with plenty of fluids.  4. Flovent 220, 2 puffs b.i.d. via the Aerochamber.  5. Avelox 400 mg, 1 p.o. q.d. for 7 more days until gone.   He was instructed to continue his home medications as prior to admission,  including:  1. Zocor 40 mg p.o. q.d.  2. Niaspan 1000 mg p.o. q.d.  3. Glipizide ? 5 mg p.o. b.i.d.  4. Actos 45 mg p.o. q.d.  5. Lisinopril 5 mg p.o. q.d.  6. Lasix 80 mg, 2 tablets p.o. q.a.m.  7. K-Dur 20, 1 tablet p.o. q.d.  8. Calcitriol 0.25 mg p.o. q.d.  9. Aciphex 20 mg p.o. q.d.  10.      Enteric coated aspirin 81 mg, 1 daily.  11.      He was instructed to stop his Coumadin for the time being.   FOLLOW UP:  He will follow up in the office with me in 10 days with a follow-  up chest x-ray and lab work, and we will arrange for a follow-up CT scan at  the end of the month and follow up with Dr. Tyrone Sage and Dr. Riley Kill.   CONDITION ON DISCHARGE:  Improved.                                               [name], [name] [facility]    SMN/MEDQ  D:  01/06/2003  T:  01/08/2003  Job:  [id number]   cc:   Gwenith Daily. Tyrone Sage, [name]  [address]  [place]  [place] [address]  Fax: [phone]   Arturo Morton. Riley Kill, [name]    cc:   Gwenith Daily. Tyrone Sage, [name]  [address]  [place]  [place] [address]  Fax: [phone]   Arturo Morton. Riley Kill, [name]

## 2010-11-21 NOTE — Assessment & Plan Note (Signed)
[facility]                          CHRONIC HEART FAILURE NOTE   NAME:[name] Day, [name] Day                    MRN:          [id number]  DATE:05/26/2006                            DOB:          1923-08-22    PRIMARY CARDIOLOGIST:  Bevelyn Buckles. Bensimhon, MD   PRIMARY CARE DOCTOR:  Scott M. Kriste Basque, MD   NEPHROLOGIST:  Duke Salvia. Eliott Nine, [name]   [name] Day returns today for further evaluation and medication  titration of his end-stage nonischemic cardiomyopathy, currently on  hospice care and being maintained on a dobutamine drip.  [name] Day  states he has been doing quite well.  He continues to have more good  days than bad days.  He was actually able to do some leaf-blowing around  the house yesterday evening; however, he states that it takes him a full  day to recover from exertional activities.  He is looking forward to  spending Thanksgiving with his family.  He denies any orthopnea, PND.  No episodes of firing from his defibrillator.  He continues to maintain  a DNR status under the care of hospice.   PAST MEDICAL HISTORY:  1. Congestive heart failure secondary to nonischemic cardiomyopathy      with an EF of 30%, status post BiV cardioverter/defibrillator.Being      maintained on dobutamine drip.  2. Atrial fib.Rate controlled.  3. Chronic renal insufficiency.  4. DNR status, under the care of hospice.  5. New diagnosis of diabetes; most likely steroid induced.   REVIEW OF SYSTEMS:  As stated above.   CURRENT MEDICATIONS:  1. Digoxin 0.125.  2. Lasix 40 mg b.i.d.  3. Hydralazine 50 mg t.i.d.  4. Lipitor 20 daily.  5. Methylprednisolone 4 mg daily.  6. Isosorbide 120 daily.  7. Coumadin per clinic.  8. Calcitriol 0.25 every other day.  9. Senokot 2 tablets at bedtime.  10.Allopurinol 100 mg daily.  11.Prilosec 20 mg daily.  12.Albuterol/Atrovent nebs.  13.Budesonide nebulizers.  14.Dobutamine drip 2.5 mcg.  15.Amaryl 4 mg  daily.   PHYSICAL EXAMINATION:  VITAL SIGNS:  Weight 177 pounds.  Blood pressure  135/74 with a pulse of 74.  GENERAL:  Patient is in no acute distress.  NECK:  Supple without lymphadenopathy.  No jugular venous distention.  CARDIOVASCULAR:  S1 and S2.  Positive S3.  Heart rhythm is irregular at  this time.  LUNGS:  Clear to auscultation bilaterally.  ABDOMEN:  Soft and nontender.  Positive bowel sounds.  LOWER EXTREMITIES:  Without clubbing or cyanosis.  He has a trace of  ankle edema.   IMPRESSION:  End-stage congestive heart failure secondary to nonischemic  cardiomyopathy, being maintained on a dobutamine infusion and followed  by hospice care.   Will continue current medications.  Patient's wife to call me if he  develops any problems, if not, I will see him back after the holidays.      Dorian Pod, ACNP  Electronically Signed      Rollene Rotunda, MD, [facility]  Electronically Signed   MB/MedQ  DD: 05/26/2006  DT: 05/26/2006  Job #: [id number]

## 2010-11-21 NOTE — Assessment & Plan Note (Signed)
[facility]                          CHRONIC HEART FAILURE NOTE   NAME:[name] Day, [name] Day                    MRN:          [id number]  DATE:10/12/2006                            DOB:          August 12, 1923    PRIMARY CARE PHYSICIAN:  Alroy Dust, [name]   PRIMARY CARDIOLOGIST:  Rollene Rotunda, [name]   NEPHROLOGIST:  Camille Bal, [name]   [name] Day returns today for followup of his congestive heart failure  which is end-stage.  He has been maintained on a Dobutamine drip under  the care of Hospice.  [name] Day has been doing well. He recently  underwent cataract surgery on his right eye, he has recovered nicely  from that.  He is pending cataract repair on his left eye April 17th.  He has complained of some numbness and tingling in his feet.  He is a  known diabetic, with a recent hemoglobin A1c of 7.3, followed by Dr.  Kriste Basque.  I have asked Mr. Carlile to follow up with Dr. Kriste Basque concerning  the possible diagnosis of the peripheral neuropathy.  He also recently  experienced a cold/upper respiratory infection, was treated with Avelox,  states he is feeling much better since that time and is back to his  normal self at this time.  Denied any orthopnea or PND, no problems with  his Port-A-Cath or Dobutamine drip.  His wife just recently underwent  hip surgery.  He has been very concerned about that, but [name] Day  is doing well in her recovery also.   PAST MEDICAL HISTORY:  1. Congestive heart failure secondary to nonischemic cardiomyopathy      with an EF of 30%, status post implant of biventricular      cardioverter-defibrillator.  Patient now end-stage heart failure,      being maintained on Dobutamine drip under the care of Hospice.  2. Atrial fibrillation, rate controlled.  3. Chronic renal insufficiency with creatinine staying between 2 and      3.  4. Do Not Resuscitate status.  5. Diabetes with questionable peripheral neuropathy.  6.  Anticoagulation therapy.  7. Hyperlipidemia.  8. Gastroesophageal reflux disease  9. Prostate cancer.  10.Gout.  11.History of hyperparathyroidism.  12.Status post bilateral renal artery ultrasound showing normal      caliber abdominal aorta with mild atherosclerosis and normal kidney      size bilaterally, with the appearance of chronic disease in 1-59%      bilateral renal artery stenosis proximally.  13.Chronic obstructive pulmonary disease.  14.Nonobstructive coronary artery disease by catheterization in the      past.   REVIEW OF SYSTEMS:  As stated above.   CURRENT MEDICATIONS:  1. Digoxin 0.125 mg on Mondays, Wednesdays and Fridays.  2. Furosemide 40 mg b.i.d.  3. Hydralazine 50 mg t.i.d.  4. Lipitor 20.  5. Methylprednisolone 4 mg.  6. Isosorbide 120.  7. Coumadin as directed.  8. Calcitriol.  9. Senokot.  10.Guaifenex.  11.Prilosec.  12.DuoNeb.  13.Budesonide nebs.  14.Amaryl 4 mg.  15.Januvia 100 mg.  16.Magnesium.  17.Klor-Con 20 mEq daily.  18.Dobutamine drip at 2.5  mcg.   PHYSICAL EXAM:  Weight today 177 pounds, which is stable for patient,  blood pressure 111/57 with a heart rate of 74.  [name] Day is in no  acute distress.  He is up ambulating with the assistance of a cane.  Dobutamine intact via Port-A-Cath.  Patient with JVD at 8 cm at a 45  degree angle.  LUNGS:  Clear to auscultation, slightly distant breath sounds  bilaterally.  CARDIOVASCULAR EXAM:  S1, S2, positive S3.  ABDOMEN:  Soft, nontender, positive bowel sounds.  LOWER EXTREMITIES:  Without clubbing, cyanosis.  He has just a trace of  edema.  NEUROLOGIC:  He is alert and oriented x3.   IMPRESSION:  Stable end-stage heart failure at this time.  Continue  medications.  Patient to followup with Dr. Kriste Basque concerning questionable  peripheral neuropathy in feet and I will see patient back in a month,  sooner if he has any problems.      Dorian Pod, ACNP  Electronically  Signed      Rollene Rotunda, MD, [facility]  Electronically Signed   MB/MedQ  DD: 10/12/2006  DT: 10/12/2006  Job #: [id number]   cc:   Duke Salvia. Eliott Nine, [name]

## 2010-11-21 NOTE — Procedures (Signed)
[facility]  Patient:    [name] Day, [name] Day                    MRN: [id number] Proc. Date: 02/04/00 Adm. Date:  [id number] Disc. Date: [id number] Attending:  Verdene Lennert                           Procedure Report  PROCEDURE PERFORMED:  Bronchoscopy.  ENDOSCOPIST:  [name], [name]  INDICATIONS:  The patient is a 77 year old gentleman who has been a heavy smoker and quit approximately two months ago.  He was recently seen in the office with a left lower lobe pneumonia and CT scan showed a 3.5 cm left lower lobe pleural base mass.  He had hemoptysis associated with this lesion and bronchoscopy is undertaken at the present time to evaluate the airways, to rule out the possibility of an endobronchial lesion.  DESCRIPTION OF PROCEDURE:  Bronchoscopy was performed in the pulmonary lab without fluoroscopy.  An IV was started.  Cardiac monitoring was done revealing normal sinus rhythm and sinus tachycardia without etopics.  Oxygen was administered at 4 liters by nasal cannula.  Continuous finger pulse oximetry is performed throughout the test.  Topical anesthesia was obtained with Xylocaine 400 mg given through the airway during the test.  Cough anesthesia was obtained with Demerol 75 mg given IV given slowly during the procedure.  General anesthesia was obtained with 5 mg of Versed given IV slowly during the test.  The patient tolerated the procedure well.  FINDINGS: 1. Upper airway and cords:  The upper airway including the    nasopharynx and posterior pharynx was normal as far as could be    seen.  The larynx including the epiglottis, false cords and    true cords appeared normal, and the vocal cords moved normally    with phonation. 2. Trachea and carina:  The trachea was normal and the carina    was sharp. 3. Bronchi:  There was essentially normal endobronchial anatomy    throughout.  There were no endobronchial lesions seen anywhere    in  the tracheobronchial tree.  Careful assessment of the    airways down to the submental levels failed to reveal any    exophytic endobronchial lesions.  The bronchial mucosa was    mildly inflamed in a diffuse fashion and somewhat friable.    There were thick whitish secretions with numerous distal plugs,    but these were lavaged clear with saline and no underlying    lesions were identified.  Careful attention was directed to the    left base where no endobronchial lesions were identified.  No    blood clots or bleeding sites seen.  IMPRESSION: Clinical history of left lower lobe pneumonia with hemoptysis. Negative bronchoscopy with no endobronchial lesions identified.  SPECIMEN: Bronchial washings for cytology and cultures include routine fungi. DD:  02/04/00 TD:  02/05/00 Job: [id number]

## 2010-11-21 NOTE — Cardiovascular Report (Signed)
[name], [name] Day             ACCOUNT NO.:  [id number]   MEDICAL RECORD NO.:  [id number]          PATIENT TYPE:  INP   LOCATION:  3732                         FACILITY:  [facility]   PHYSICIAN:  Charlton Haws, [name]     DATE OF BIRTH:  17-Apr-1924   DATE OF PROCEDURE:  05/19/2004  DATE OF DISCHARGE:                              CARDIAC CATHETERIZATION   PROCEDURE:  Coronary arteriography.   INDICATIONS:  Significant decrease in left ventricular function, with atrial  fibrillation and ventricular tachycardia, congestive heart failure  clinically.   DESCRIPTION OF PROCEDURE AND FINDINGS:  Limited catheterization is done from  the right femoral artery and vein.  Visipaque 30 cc was used.   FINDINGS:  1.  LEFT MAIN:  The left main coronary artery had a 20% discrete lesion.  2.  LEFT ANTERIOR DESCENDING ARTERY:  Had a 40% eccentric lesion at the      proximal portion at the takeoff of the first diagonal branch. The distal      vessel had 30% multi-discrete lesions.  The first diagonal branch had 20-30% multiple discrete lesions.  1.  CIRCUMFLEX CORONARY ARTERY:  Normal.  2.  RIGHT CORONARY ARTERY:  Normal.   Right heart catheterization was done, given the patient's heart failure and  renal failure.   HEMODYNAMICS:  1.  Mean right atrial pressure:  10 mmHg.  2.  Right ventricular pressure:  45/14.  3.  PA pressure:  43/18.  4.  Mean pulmonary capillary wedge pressure:  20 mmHg.  5.  Aortic pressure:  129/54.   IMPRESSION:  The patient has no critical coronary artery disease.  He would  appear to have nonischemic cardiomyopathy with nonsustained ventricular  tachycardia and atrial fibrillation.  I suspect he would benefit from DC  cardioversion.   The patient will have his hydralazine stopped.  I suspect this is the cause  of his total body rash.  He will have his creatinine checked in the morning.  EP Services is to evaluate the patient in regards to his atrial fibrillation  and  VT.       PN/MEDQ  D:  05/19/2004  T:  05/19/2004  Job:  [id number]   cc:   Jim Desanctis. Lucina Mellow, [name]  [phone] [address], [address]  [place]  [place] [address]  Fax: [phone]   [name], [name] [facility]

## 2010-11-21 NOTE — Consult Note (Signed)
[name], [name] Day NO.:  [id number]   MEDICAL RECORD NO.:  [id number]          PATIENT TYPE:  INP   LOCATION:  3732                         FACILITY:  [facility]   PHYSICIAN:  Franklyn Lor, MD         DATE OF BIRTH:  [birth year]   DATE OF CONSULTATION:  05/17/2004  DATE OF DISCHARGE:                                   CONSULTATION   ATTENDING RENAL PHYSICIAN:  Maree Krabbe, [name]   REASON FOR CONSULTATION:  Management of renal function in patient with  chronic renal insufficiency scheduled for cardiac catheterization on  November 14.   HISTORY OF PRESENT ILLNESS:  [name] is a pleasant, 77 year old white  male who is followed by Dr. Camille Bal of the [facility]  who is admitted by Dr. Alroy Dust of [facility] for decompensated  CHF and bilateral pleural effusions.  Prior to admission, the patient  described progressive shortness of breath, dyspnea on exertion, that  eventually became debilitating and led to decreased p.o. and generalized  weakness.  This patient has a complex medical history which includes  bilateral renal artery stenosis, coronary artery disease, atrial  fibrillation, COPD, diabetes, and chronic renal insufficiency and he is  scheduled for cardiac catheterization on May 19, 2004.  His baseline  creatinine is 2 and his current creatinine is 2.  Concern has been raised by  the primary team for contrast-induced nephropathy given the patient's  history of renal artery stenosis and chronic renal insufficiency.   PAST MEDICAL HISTORY:  1.  Bilateral renal artery stenosis.  The patient had an MRA in 2002 that      showed ostial stenosis of the right renal artery and moderate stenosis      in the mid to distal portion of the left main renal artery and      apparently had renal PTA and stent placement in 2003 although there is      no documentation available at this time to support that.  2.  Chronic renal  insufficiency with baseline creatinine of 2.  3.  Diabetes, non-insulin-dependent for approximately four to five years.  4.  Coronary artery disease.  The patient is status post CABG in 2001 and      status post catheterization in 2003.  5.  Congestive heart failure.  A 2-D echo performed during this      hospitalization showed EF of approximately 25-30%.  6.  Chronic atrial fibrillation on Coumadin.  7.  COPD, bullous disease.  8.  Hypertension.  9.  Pulmonary hypertension.  10. Hyperlipidemia.  11. Gastroesophageal reflux disease with a history of hiatal hernia      diagnosed by EGD.   HOSPITAL MEDICATIONS:  At the time of consultation, Glucotrol, Protonix,  calcitriol, Coumadin, niacin, guaifenesin, Zocor, aspirin, Lasix, sliding  scale insulin, Atrovent, Xopenex, digoxin, K-Dur, diltiazem, Coreg, heparin,  Phenergan p.r.n., Ativan p.r.n.  Additional p.r.n. medications not listed.   SOCIAL HISTORY:  The patient is married with three children.  He does smoke  and uses alcohol occasionally.  Denies other drug use.  FAMILY HISTORY:  Significant for coronary artery disease, diabetes,  hypertension, colon and prostate cancer.   REVIEW OF SYSTEMS:  Significant for positive fatigue, positive dyspnea on  exertion, and increase in his measured CBGs prior to admission.  The patient  admitted to decreased p.o.  Denied chest pain, denied change in bowel or  bladder habits.   PHYSICAL EXAMINATION:  VITAL SIGNS:  At the time of consultation,  temperature 97.7, pulse 63, respirations 18, BP 98/60, O2 saturation 95-100%  on 2 L nasal cannula.  CBGs in the 24 hours prior to this consult ranged  from 169 to 193.  Urine output in the prior 24 hours was 2.175 L with a  positive fluid balance over the last 24 hours.  GENERAL:  This is an elderly white male who is actually taking a breathing  treatment at the beginning of this exam, but sitting up in bed in no  apparent distress with no increase  work of breathing.  He alert and oriented  x3.  CHEST:  Irregular rhythm with a regular rate.  No murmurs auscultated.  Lungs demonstrate moderate aeration with decreased breath sounds at the  bases bilaterally and scattered rhonchi and wheezes.  ABDOMEN:  Soft, nontender, nondistended with positive bowel sounds.  EXTREMITIES:  2+ pulses, upper and lower extremities, with no edema and no  cyanosis.   LABORATORY DATA:  Sodium 138, potassium 4.3, chloride 102, bicarb 22, BUN  39, creatinine 2, glucose 134, calcium 9.1.  BNP 1900.  PT INR 19.2 and 2  respectively.  Heparin level 0.74.  Chest x-ray on November 9 showed no  change from a chest x-ray on November 5 which showed bilateral basal or air  space disease with atelectasis and bilateral effusions, cardiomegaly, COPD.   ASSESSMENT/PLAN:  The patient is a 77 year old white male with history of  coronary artery disease, congestive heart failure, bilaterally renal artery  stenosis, and chronic renal insufficiency among other things.  Scheduled for  cardiac catheterization on November 14.  1.  Chronic renal insufficiency.  The patient does not demonstrate an acute      change in renal function at this time but is at risk for contrast-      induced nephropathy secondary to his history of bilateral renal artery      stenosis in his present state of decompensated congestive heart failure.      Review of the records as stated in the past medical history reveals that      the patient has had remediation of bilateral renal artery stenosis      although the extent of which is unknown at this time.  Assuming he has      stable renal function since that time, the patient is likely at moderate      risk for dye-induced nephropathy.  Clinically, he does not demonstrate      peripheral edema at this time but the presence of an elevated BNP and      bilateral pleural effusions on chest x-ray suggest central edema for     now.  Maintenance of Lasix at  the current dose is a viable option as the      majority of the management from a renal standpoint will likely occur      after the cath procedure once we see how the dye affects the patient's      kidneys.  2.  Diabetes, non-insulin-dependent, improved on sliding scale and      Glucotrol.  Continue per primary team.  3.  Coronary artery disease as above.  The patient for catheterization on      Monday, November 14.  4.  Atrial fibrillation, digoxin and diltiazem, rate currently controlled      but irregular.  5.  Chronic obstructive pulmonary disease.  Xopenex and Atrovent.  The      patient improving.  6.  Congestive heart failure improving.  BNP which is trending down.  The      patient clinically improved with good oxygen saturations.       TD/MEDQ  D:  05/17/2004  T:  05/18/2004  Job:  [id number]   cc:   Olga Millers, [name] [facility] M. Kriste Basque, [name] [facility]   Maree Krabbe, [name]  Fax: [phone]

## 2010-11-21 NOTE — Op Note (Signed)
[name], [name] Day             ACCOUNT NO.:  [id number]   MEDICAL RECORD NO.:  [id number]          PATIENT TYPE:  AMB   LOCATION:  SDS                          FACILITY:  [facility]   PHYSICIAN:  Salvadore Farber, [name] LHCDATE OF BIRTH:  04/08/1924   DATE OF PROCEDURE:  10/23/2005  DATE OF DISCHARGE:                                 OPERATIVE REPORT   PROCEDURE:  Selective bilateral renal angiography.   INDICATION:  Mr. Falconi is a 77 year old gentleman with severe  atherosclerotic disease including severe ischemic cardiomyopathy who  presents with progressive renal insufficiency with creatinine rising as high  as 3.7. It is currently 2.6. MRA had previously raised question of bilateral  renal artery stenosis. Duplex ultrasound, a year ago, suggested no  significant stenosis. With with the fairly rapid deterioration in his renal  function, Dr. Eliott Nine has requested invasive angiography for definitive  exclusion of renal artery stenosis as a contributor to his nephropathy. The  risks of the procedure including bleeding, infection, vascular injury, and  worsening of renal function were explained to the patient and his wife in  detail. They agreed to proceed.   PROCEDURAL TECHNIQUE:  Informed consent was obtained as detailed above.  Underwent 1% lidocaine local anesthesia, a 5-French sheath was placed in the  right common femoral artery using THE modified Seldinger technique. A  pigtail catheter was advanced into the suprarenal abdominal aorta. Abdominal  aortography was performed using carbon dioxide as the contrast agent. A 5-  French LIMA catheter was then used to selectively engage each renal artery.  Angiography was performed using a dilute iodinated contrast by hand  injection. This demonstrated no significant renal artery stenosis on either  side. The catheter was then removed over a wire.  Complete hemostasis was  obtained. He was then transferred to the holding room in stable  condition.   TOTAL CONTRAST:  3 mL of iodinated contrast   COMPLICATIONS:  None.   FINDINGS:  1.  Right renal artery:  Single vessel. The ostium of the vessel is normal.      There is a 30% stenosis distally. The distal vasculature is severely      pruned.  2.  Left kidney:  There is a single renal artery. There is calcium      proximally, but less than 30% stenosis and no translesional gradient is      assessed with a 5-French catheter. The distal vasculature is severely      pruned.   IMPRESSION/RECOMMENDATIONS:  No significant atherosclerotic disease of the  major proximal renal vessels. There is distal pruning consistent with small  vessel atherosclerotic disease.      Salvadore Farber, [name] [facility]  Electronically Signed     WED/MEDQ  D:  10/23/2005  T:  10/26/2005  Job:  [id number]   cc:   Rollene Rotunda, [name]  [address]  [address]  [place]  [place] [address]   [name], [name] [facility]  [address]  [place]  [place] [address]   Duke Salvia. Eliott Nine, [name]  Fax: [phone]

## 2010-11-21 NOTE — H&P (Signed)
[name] Day, [name] Day             ACCOUNT NO.:  [id number]   MEDICAL RECORD NO.:  [id number]          PATIENT TYPE:  INP   LOCATION:  0369                         [facility]ACILITY[facility]   PHYSICIAN:  [name], [name] [facility] OF BIRTH:  30-Apr-1924   DATE OF ADMISSION:  05/08/2004  DATE OF DISCHARGE:                                HISTORY & PHYSICAL   CHIEF COMPLAINT:  Weak and unable to eat x1 week.   HISTORY OF PRESENT ILLNESS:  Patient is a 77 year old white male who has a  very complicated medical history, including severe COPD, atrial  fibrillation, arteriosclerotic heart disease, peripheral vascular disease,  and diabetes mellitus.  The patient returns today related to persistent  weakness, anorexia, nausea, vomiting, and elevated blood sugars averaging  300-400.  Initially last week, the patient began having nausea and vomiting  shortly after beginning Biaxin and Flagyl, that was prescribed by GI doctor,  Dr. Corinda Gubler, secondary to recent endoscopy.  Patient was seen in the office  two days ago by Dr. Kriste Basque.  At that time, Biaxin and Flagyl were  discontinued.  Patient was encouraged to push fluids, and lab work was done.  Patient's vomiting has resolved; however, patient continues to be extremely  weak, dizzy, and blood sugars remained elevated above 300.  Patient has had  an extensive GI workup recently by Dr. Corinda Gubler related to anorexia,  weakness, loose stools, and weight loss.  Patient was found to have moderate  esophageal dysmotility and hiatal hernia per an upper GI series.  He did  undergone an endoscopy two weeks ago.  Those results are unavailable;  however, the patient does state that he had an infection which sounds like  H. pylori and was treated with Biaxin and Flagyl.  Patient presents today in  the office extremely weak, and blood sugars at 306.  Patient is accompanied  by his wife, who states that he has had very little po intake over the last  week.  Patient  required hospitalization for further evaluation and IV  fluids.   PAST MEDICAL HISTORY:  1.  COPD and bullous emphysema.  2.  Arteriosclerotic heart disease with chronic atrial fibrillation and      congestive heart failure, followed by Dr. Chales Abrahams.  3.  Arteriosclerotic peripheral vascular disease with known carotid      occlusive disease, increased on the left, bilateral renal artery      stenosis, and previous TIA.  4.  Pulmonary hypertension.  5.  Hypertension.  6.  Chronic renal insufficiency with baseline creatinines of about 2.  He is      followed by Dr. Eliott Nine at [facility].  7.  Hyperlipidemia.  8.  Diabetes mellitus with last A1C at 5.6 in July, 2005.  9.  Prostate cancer, status post TURP in 1990 by Dr. Sherian Rein.  10. Gastroesophageal reflux and hiatal hernia.  11. Diverticulosis and colon polyps with a positive family history of colon      cancer.  12. Degenerative arthritis, status post left hip replacement.   CURRENT MEDICATIONS:  1.  Coumadin, per Coumadin clinic.  2.  Combivent 2 puffs q.i.d.  3.  Flovent 220 mcg 2 puffs b.i.d.  4.  Rocaltrol 0.25 mcg 2 daily.  5.  Niacin 1000 mg q.h.s.  6.  K-Dur 10 mEq daily.  7.  Guaifenesin 600 mg b.i.d.  8.  Zocor 40 mg q.h.s.  9.  Glipizide 5 mg b.i.d.  10. Nexium 40 mg daily.  11. Lasix 80 mg daily.  12. Aspirin 81 mg daily.   DRUG ALLERGIES:  PENICILLIN.   SOCIAL HISTORY:  Patient is married.  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  He is previously retired from [employer].  He has  three children.  He does continue to smoke.  Rare alcohol use.   FAMILY HISTORY:  Positive for coronary artery disease, diabetes mellitus,  hypertension, colon cancer, and prostate cancer.   REVIEW OF SYSTEMS:  Positive for dizziness, weakness, anorexia.  Negative  for hemoptysis, chest pain, palpitations, abdominal pain, bloody stools.   PHYSICAL EXAMINATION:  VITAL SIGNS:  Temperature 97.1, blood pressure   114/72, pulse rate is irregular at 127, respiratory rate 20, oxygen  saturation is 94% on room air.  Weight is up 1 pound at 155.  GENERAL:  Patient is a frail, elderly white male in no acute distress.  HEENT:  PERRLA.  Normocephalic.  Atraumatic.  TM's are normal.  Nasal mucosa  is pink and moist.  Posterior pharynx is somewhat pale.  NECK:  Supple without cervical adenopathy.  No JVD.  Carotids are equal with  positive upstrokes bilaterally.  There is a positive bruit on the left.  LUNGS:  Bibasilar crackles with a few scattered rhonchi.  HEART:  Irregularly irregular rhythm.  ABDOMEN:  Soft without any hepatosplenomegaly.  No guarding or rebound  noted.  Bowel sounds are positive throughout all upper quadrants.  No  guarding or rebound.  No abdominal bruit noted.  EXTREMITIES:  Warm without any clubbing or cyanosis.  There is 1-2+ edema  bilaterally.  Pulses are 1+.  Patient has an unsteady gait and has  generalized weakness.  NEUROLOGIC:  Alert and oriented x 3.   IMPRESSION/PLAN:  1.  Weakness and anorexia with recent gastroenteritis:  Symptoms seem to      have been worsened after the antibiotics, Flagyl and Biaxin were given.      Patient will advance diet as tolerated.  IV fluids.  PPI therapy b.i.d.      and will have Phenergan as needed.  2.  Diabetes mellitus with significant hyperglycemia:  Patient will be      started on sliding-scale insulin and will increase fluids.  3.  Emphysematous chronic obstructive pulmonary disease:  Will continue on      his current regimen.  Chest x-ray is pending.  Will follow up      clinically.  4.  Fluid overload:  BMP is pending.  May need to repeat a 2D echo.      TP/MEDQ  D:  05/08/2004  T:  05/08/2004  Job:  [id number]   cc:   Duke Salvia. Eliott Nine, [name]  [address]  [place]  [place] [address]  Fax: [phone]   Veneda Melter, [name]

## 2010-11-21 NOTE — H&P (Signed)
[name], [name] Day             ACCOUNT NO.:  [id number]   MEDICAL RECORD NO.:  [id number]          PATIENT TYPE:  AMB   LOCATION:  SDS                          FACILITY:  [facility]   PHYSICIAN:  Salvadore Farber, [name] LHCDATE OF BIRTH:  July 28, 1923   DATE OF ADMISSION:  10/23/2005  DATE OF DISCHARGE:                                HISTORY & PHYSICAL   PRIMARY CARE PHYSICIAN:  Dr. Kriste Basque   PRIMARY CARDIOLOGIST:  [facility]   PERIPHERAL VASCULAR PHYSICIAN:  Dr. Randa Evens   RENAL MD:  Dr. Camille Bal.   CHIEF COMPLAINT:  Renal artery stenosis.   HISTORY OF PRESENT ILLNESS:  [name] Day is a 77 year old white male with a  history of non-obstructive coronary artery disease by catheterization in  2002.  He has been followed by Dr. Eliott Nine for renal insufficiency and had an  ultrasound that showed bilateral renal artery stenosis.  Recently his  creatinine has been increasing and Dr. Samule Ohm evaluated the data and felt  that renal angiogram was indicated and possible intervention.  [name] Day  is here today for the procedure.   PAST MEDICAL HISTORY:  1.  Status post cardiac catheterization March 2002 with an LAD 30% and 50%      stenosis, circumflex and RCA with luminal irregularities, and pulmonary      artery pressure is 54/9.  2.  EF of 30% by echocardiogram.  3.  Hyperlipidemia.  4.  Status post Guidant Bi-V ICD.  5.  Chronic kidney disease.  6.  Permanent atrial fibrillation.  7.  Chronic anticoagulation with Coumadin.  8.  Congestive heart failure, class IIIB-IV.  9.  COPD.  10. Gastroesophageal reflux disease.  11. Secondary hyperparathyroidism.  12. History of gout.  13. History of prostate cancer.   SURGICAL HISTORY:  1.  Status post cardiac catheterization.  2.  Status post Bi-V ICD.  3.  History of knee surgery.  4.  Status post left total hip replacement.  5.  Status post TURP.  6.  Colonoscopy/EGD.   ALLERGIES:  PENICILLIN.   CURRENT MEDICATIONS:  1.  Doxycycline dose unknown and Septra dose unknown.  He alternates each      antibiotic for 10 days and there is a 10-day gap in between each      medication.  2.  Fluvastatin 80 mg daily.  3.  Digoxin and 0.125 one-half daily.  4.  Coreg 6.25 b.i.d.  5.  Coumadin.  6.  Omeprazole 20 b.i.d.  7.  AeroBid two puffs b.i.d.  8.  Albuterol and Atrovent nebulizers t.i.d., recently increased from b.i.d.  9.  Guaifenex 600 b.i.d.  10. Furosemide 80 mg a.m., 40 mg p.m.  11. Tramadol p.r.n.  12. Combivent p.r.n.  13. Allopurinol p.r.n.   SOCIAL HISTORY:  He lives in [place] with his wife.  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  He has a greater than 50-pack-year history of tobacco  use and quit 2004.  He does not abuse alcohol or drugs.   FAMILY HISTORY:  His mother died in her 4s and his  father died in his 38s.  Both of them have cancer.  Neither one had heart disease but he has three  siblings with a history of coronary artery disease as well as one that has a  pacemaker.  Two of the siblings with coronary artery disease have died.   REVIEW OF SYSTEMS:  Significant for dyspnea on exertion which is chronic but  he feels slightly worse recently.  He has chronic orthopnea and had PND once  last week.  He has had some lower extremity edema recently but this has  improved.  He has coughing and occasionally wheezing.  He has thigh pain  bilaterally with ambulation.  He has occasional diarrhea secondary to the  antibiotic therapy and occasional reflux symptoms.  His feet stay cold all  the time.  Review of systems is otherwise negative.   PHYSICAL EXAMINATION:  VITAL SIGNS:  Temperature is 97.1, blood pressure  126/70, heart rate 70, respiratory rate 16, O2 saturation 97% on room air.  GENERAL:  He is a well-developed, elderly white male in no acute distress.  HEENT:  His head is normocephalic and atraumatic with pupils equal, round,  reactive to light and  accommodation.  Extraocular movements intact.  Sclerae  clear.  Nares without discharge.  NECK:  There is no lymphadenopathy, thyromegaly, or JVD noted.  He has  bilateral high-pitched carotid bruits.  CV:  His heart is regular in rate and rhythm with an S1, S2 and no  significant murmur, rub, or gallop is noted.  LUNGS:  Essentially clear to auscultation bilaterally.  SKIN:  No rashes or lesions noted.  ABDOMEN:  Soft and nontender with active bowel sounds.  EXTREMITIES:  There is no clubbing or edema noted.  His distal pulses are  decreased in all four extremities but no femoral bruits are appreciated.  Popliteal pulses are also decreased.  Femoral pulses and brachial pulses are  2+ bilaterally.  MUSCULOSKELETAL:  He has no joint deformity or effusions and no spine or CVA  tenderness.  NEUROLOGIC:  He is alert and oriented.  Cranial nerves II-XII grossly  intact.   EKG is atrial fibrillation with ventricular pacing, rate 74.   LABORATORY VALUES:  Dated April 17 include hemoglobin 12.8, hematocrit 37,  WBC 6.9, platelets 207.  Sodium 144, potassium 3.2, chloride 107, CO2 27,  BUN 65, creatinine 2.6, glucose 107.  INR 2.   IMPRESSION:  1.  Bilateral renal artery stenosis:  [name] Day is here today for a renal      angiogram and with further evaluation and treatment depending on the      results.  2.  Hypokalemia.  This was supplemented at the time and a recheck is      pending.  3.  Renal insufficiency:  Recheck is pending and he is to follow up with Dr.      Eliott Nine.  4.  Anticoagulation:  Coumadin has been held for five days and a repeat INR      is pending.  5.  [name] Day is otherwise stable and will be continued on all of his      home medications.  He is between antibiotic therapies and he is to      restart his antibiotics per Dr. Jodelle Green scheduled when he goes home. 6.  Questionable peripheral vascular disease:  Further evaluation will be      per Dr. Samule Ohm as an  outpatient.      Theodore Demark, P.A. [facility]  Salvadore Farber, [name] [facility]  Electronically Signed    RB/MEDQ  D:  10/23/2005  T:  10/23/2005  Job:  [id number]

## 2010-11-21 NOTE — H&P (Signed)
[name], [name] Day                       ACCOUNT NO.:  [id number]   MEDICAL RECORD NO.:  [id number]                   PATIENT TYPE:  INP   LOCATION:  2017                                 FACILITY:  [facility]   PHYSICIAN:  Arturo Morton. Riley Kill, [name]             DATE OF BIRTH:  Feb 14, 1924   DATE OF ADMISSION:  12/28/2002  DATE OF DISCHARGE:                                HISTORY & PHYSICAL   CHIEF COMPLAINT:  I have an abnormal lung.   HISTORY OF PRESENT ILLNESS:  [name] is known to our service.  He is  followed currently by Dr. Chales Abrahams.  He is a 77 year old who was admitted to  the hospital with progressive fatigue and weakness.  The patient had  hypotension with a blood pressure of 80/50 and was pale and clammy.  He  eventually underwent a chest x-ray which revealed a stable mass-like density  at the medial left lung base.  This was thought to correlate to an infected  bleb seen on chest x-ray.  It was felt to unlikely be a mass.  He has been  seen by Dr. Tyrone Sage, who feels that surgery will be necessary.  The patient  previously underwent cardiac catheterization.  The catheterization was done  by Dr. Chales Abrahams.  This revealed 30% and 50% lesions at the LAD and also  evidence of moderately significant pulmonary hypertension with preserved  left ventricular function.  This was done in March 2002.  The patient knows  that he has had a stress test in the interim done in our office, as he  specifically identifies Burna Mortimer.   He has had recent hemoptysis and is now for potential surgery.   PAST MEDICAL HISTORY:  1. Coronary artery disease.  2. Chronic obstructive pulmonary disease.  3. Atherosclerotic peripheral vascular disease with workup by Dr. Chales Abrahams with     moderate diffuse atheromatous disease, 50% left renal artery stenosis,     40%-50% right renal artery stenosis.  4. Chronic atrial fibrillation on Coumadin anticoagulation.  5. Diabetes mellitus.  6. Increased lipids.   MEDICATIONS AT TIME OF ADMISSION:  1. Coumadin daily.  2. Combivent 2 sprays q.i.d.  3. Flovent 220 mcg 2 puffs b.i.d.  4. Calcitrol 0.25 mg 2 p.o. daily.  5. Lasix 60 mg daily.  6. Albuterol nebulizer p.r.n.  7. Niacin 1000 mg q.h.s.  8. Aciphex 20 mg daily.  9. Aspirin 81 mg daily.  10.      Allegra 180 mg daily.  11.      Potassium 10 mEq daily.  12.      Guiafenesin 600 mg p.o. b.i.d.  13.      Zocor 40 mg daily.  14.      Allopurinol 300 mg daily.  15.      Glipizide 10 mg p.o. b.i.d.  16.      Actos 45 mg daily.  17.  Lisinopril 5 mg daily.   SOCIAL HISTORY:  The patient is married.  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  He does not drink.  He is retired from [employer].   FAMILY HISTORY:  Father died at 71 or 54 of prostate cancer and mother died  of colon cancer.   REVIEW OF SYSTEMS:  Remarkable for fatigue and weakness for 2-3 weeks.  Positive for hemoptysis.  The patient has had some cold intolerance and hip  replacement.   PHYSICAL EXAMINATION:  GENERAL:  He is an elderly but alert and pleasant 65-  year-old gentleman.  VITAL SIGNS:  Blood pressure is 104/78, pulse is 88, temperature 97.4.  HEENT:  Unremarkable.  NECK:  There were no bruits.  CARDIAC:  Rhythm is irregularly irregular.  LUNGS:  Decreased breath sounds at the bases with rhonchi that clear with  cough.  SKIN:  Warm and dry.   LABORATORY DATA:  Chest x-ray is as noted.  EKG reveals atrial fibrillation  with controlled ventricular response and left bundle branch block.   IMPRESSION:  1. Mild coronary artery disease.  2. Chronic atrial fibrillation on Coumadin anticoagulation.  3. Left lung mass.  4. Chronic obstructive pulmonary disease.  5. Pulmonary hypertension.  6. Atherosclerotic peripheral vascular disease.  7. Diabetes.  8. Hyperlipidemia.  9. Hypertension.  10.      Prostate cancer history.   PLAN AND RECOMMENDATIONS:  The patient had  no ischemia by Cardiolite in  2002.  At the present time, however, this is nearly a year and a half later  and we would recommend a repeat Cardiolite to be done preoperatively.  If  this shows no ischemia, then he should be stable for surgery although with  his pulmonary hypertension there is some increased risk.  We would also  likely get a 2-D echocardiogram to better identify the reasons for pulmonary  hypertension.                                               Arturo Morton. Riley Kill, [name]    TDS/MEDQ  D:  12/29/2002  T:  12/30/2002  Job:  [id number]   cc:   Veneda Melter, [name]    cc:   Veneda Melter, [name]

## 2010-11-21 NOTE — Op Note (Signed)
NAME:  LEIBY, PIGEON             ACCOUNT NO.:  [id number]   MEDICAL RECORD NO.:  [id number]          PATIENT TYPE:  INP   LOCATION:  2014                         FACILITY:  [facility]   PHYSICIAN:  Arvilla Meres, [name] LHCDATE OF BIRTH:  1923-12-01   DATE OF PROCEDURE:  11/23/2005  DATE OF DISCHARGE:                                 OPERATIVE REPORT   OPERATION/PROCEDURE:  Direct current cardioversion.   INDICATIONS:  Mr. Woodin is a 77 year old male with Class IV congestive  heart failure secondary to his left systolic dysfunction as well as severe  COPD and chronic atrial fibrillation.  He was admitted with low output  symptoms.  He has been initiated on dobutamine.  We have floated him on  amiodarone in attempt at cardioversion on Friday.  He initial converted to  sinus rhythm briefly and then to atrial flutter and then back to atrial  fibrillation.  He was given four more grams of amiodarone p.o. and  cardioversion was reattempted today.   SEDATION:  Per anesthesia with sodium pentothal.   DESCRIPTION OF PROCEDURE:  The patient received one synchronized 200 joule  biphasic shock which initially placed him into normal sinus rhythm.  However, followup intracardiac electrogram showed current atrial flutter.   DISPOSITION:  Will leave him on the amiodarone. Will discharge him home  today on dobutamine.  He will follow up in the office in one to two weeks.  Consider possible repeat cardioversion after a month of amiodarone.      Arvilla Meres, [name] [facility]  Electronically Signed     DB/MEDQ  D:  11/23/2005  T:  11/23/2005  Job:  [id number]

## 2010-11-21 NOTE — Assessment & Plan Note (Signed)
[facility]                                   ON-CALL NOTE   NAME:[name] Day, [name] Day                      MRN:          [id number]  DATE:04/15/2006                            DOB:          1924-01-22    PROBLEM:  [name] Messier RN hospice nurse, phone number [phone], called this evening  about this patient because of abnormal lab work.  He is DNR for heart  disease on a home dobutamine drip by PCA pump, Lasix 40 mg b.i.d., oxygen  and Medrol 4 mg daily with no history of diabetes.  He was said to have  played golf yesterday, according to his wife, although we are not sure what  that actually represented and nurse's impression was that he did not look  particularly distressed on visit today.  Routine labs have returned showing  a sodium of 114 and a glucose of 696.  Because of his DNR status, I  discussed the situation with Dr. Billy Fischer by telephone but we agreed  and I passed on to the hospice nurse that these electrolyte abnormalities  were not likely to be manageable adequately on an outpatient basis and it  was recommended that he be taken to the emergency room anticipating  admission.       Clinton D. Maple Hudson, MD, FCCP, FACP      CDY/MedQ  DD:  04/15/2006  DT:  04/17/2006  Job #:  [id number]   cc:   [name], MD

## 2010-11-21 NOTE — Consult Note (Signed)
[name], [name] Day                       ACCOUNT NO.:  [id number]   MEDICAL RECORD NO.:  [id number]                   PATIENT TYPE:  INP   LOCATION:  2017                                 FACILITY:  [facility]   PHYSICIAN:  Gwenith Daily. Tyrone Sage, [name]            DATE OF BIRTH:  1923-10-21   DATE OF CONSULTATION:  DATE OF DISCHARGE:                                   CONSULTATION   REQUESTING PHYSICIAN:  Dr. Lonzo Cloud. Nadel.   FOLLOWUP PULMONOLOGIST:  Dr. Kriste Basque.   PRIMARY CARE PHYSICIAN:  Dr. Kriste Basque.   REASON FOR CONSULTATION:  Hemoptysis thought secondary to left lower lobe  fungus ball.   HISTORY OF PRESENT ILLNESS:  The patient is a 77 year old male who presented  to the [facility] Emergency Room on December 27, 2002 with new onset of  hemoptysis with cough.  He has chronic sputum production on a daily basis  with underlying severe emphysema, however, on the day of admission, noted  coughing up blood-tinged sputum which he had not previously done.  A CT scan  was done at [facility] which the report reads probable fungus ball, left  lower lobe.  The patient is on Coumadin for chronic atrial fibrillation and  has been for four to five years.  He was started on IV antibiotics, his  Coumadin was held and his hemoptysis cleared.  Pulmonary function studies  revealed FEV-1 of 1.3 to 1.5; approximately a year and a half ago, FEV-1 was  1.8.  The patient has been a long-term smoker for many years.  He had  stopped, but in the last two years had resumed smoking.  He has a previous  cardiac history, denies myocardial infarction, but has had previous  evaluation by cardiology and has been in chronic atrial fibrillation, on  Coumadin; he is followed by Dr. Charlies Constable.  Cardiac risk factors include  hyperlipidemia, hypertension, diabetes, tobacco, current, ongoing, and long-  term tobacco use.  Denies any previous stroke.  He does have peripheral  vascular disease with claudication.  He has  renovascular disease.  Also,  previous note said that there was indication for renal angioplasty but this  was not performed.  Baseline creatinine is 2.1.   PAST MEDICAL HISTORY:  Past medical history as noted above:  1. Coronary artery disease, March of 2002:  Normal ejection fraction, 30-50%     lesions of the LAD and moderate pulmonary hypertension.  2. Extracranial carotid disease, moderate left internal carotid stenosis, 60-     90% by Doppler in September 2002.  3. Bilateral renal artery stenosis with renal insufficiency, noted above,     followed by Dr. Aram Beecham B. Dunham.  4. COPD, currently smoking a half a pack of cigarettes a day.  5. Chronic atrial fibrillation x20 years, on Coumadin.  6. Diabetes mellitus.  Recent hemoglobin is 6.1.  7. Elevated lipid profile.  8. Hypertension.  9. Congestive heart  failure.  10.      History of GERD.  11.      History of prostate cancer, status post TURP in 1990 by Dr. Excell Seltzer.     Wrenn.  12.      History of diverticulosis and colonic polyps.   PAST SURGICAL HISTORY:  Previous surgeries include a total hip replacement  secondary to severe arthritis.   SOCIAL HISTORY:  The patient is married.  He is employed as a Development worker, international aid at  [employer].   MEDICATIONS:  Medications include albuterol, Atrovent, Tussionex, Protonix,  Zocor, Niacin, glyburide, guaifenesin, Calcitrol, Lasix, Avelox, Ultram.   DRUG ALLERGIES:  Drug allergies include PENICILLIN, which caused itching.   REVIEW OF SYSTEMS:  GENERAL:  The patient notes a general low-grade  temperature and malaise for several months.  Denies any change in weight.  RESPIRATORY:  Respiratory as noted above with shortness of breath with  exertion.  GI:  Does have symptoms of reflux.  He has a history of colonic  polyps and diverticulosis.  Denies any blood in his stool recently.  MUSCULOSKELETAL:  Musculoskeletal is positive for osteoarthritis with pain  in his hips, status post left hip  replacement.  ENT:  Wears glasses.  SKIN:  Has no ischemic changes in his skin.  Does have superficial varicosities in  both lower legs and has mild pedal edema.  Denies any psychiatric history.  Denies any hematologic problems and easy bruisability, in spite of being on  Coumadin.   PHYSICAL EXAMINATION:  VITAL SIGNS:  Temperature is 97.  Blood pressure is  150/70.  Respiratory rate is 18.  Heart rate is 90.  The patient weighs 172  pounds; he is 71 inches tall.  GENERAL:  In general, he appears to have chronic lung disease with increased  AP diameter and mild shortness of breath, even at rest.  HEENT:  Pupils are equal, round and reactive to light.  NECK:  Neck has mild left carotid bruit.  LUNGS:  Lungs have decreased breath sounds bilaterally, but without actual  wheezing.  CARDIAC:  Exam reveals a regular rate and rhythm without murmur or gallop.  ABDOMEN:  Exam is benign without palpable masses or organomegaly.  EXTREMITIES:  Examination of his lower extremities revealed +1 DP and PT  pulses bilaterally.  He has mild venous stasis changes in both lower  extremities.   LABORATORY AND ACCESSORY CLINICAL DATA:  CT scan done at [facility] at the  time of his admission reveals evidence of significant pulmonary disease and  an area in the left lower lobe which previously was a large cyst but is now  a fluid-filled cavity.  There are no associated masses with it.  This  appears most likely consistent with a fungus ball which has developed in an  old pulmonary cyst and with the combination of being on Coumadin, resulted  in hemoptysis.   SUGGESTION:  1. Cardiac and renal consultations.  2. If cleared medically for surgery, consider left thoracotomy and wedge     resection of the affected area of lung.  With the patient's multiple     medical problems, this approach does carry significant pulmonary risk to    the patient, however, with his chronic atrial fibrillation, ideally he      should be able to be put back on Coumadin.  If the mass is a fungus ball,     it is unlikely to be treated well without resection.  With his  longstanding history of cigarette use, there is a possibility that this     could be a lung malignancy also.  I have discussed with the patient the     risks of surgery and will consider thoracotomy after further evaluation     by the renal and the cardiology services.                                               Gwenith Daily Tyrone Sage, [name]    Tyson Babinski  D:  12/29/2002  T:  12/30/2002  Job:  [id number]

## 2010-11-21 NOTE — Discharge Summary (Signed)
[place]  Patient:    [name] Day, [name] Day                      MRN: [id number] Adm. Date:  09/09/00 Disc. Date: 09/10/00 Attending:  Veneda Melter, [name] [facility] Dictator:   Annett Fabian, P.A.                           Discharge Summary  DISCHARGE DIAGNOSES: 1. Coronary artery disease. 2. Atrial fibrillation.  HOSPITAL COURSE:  The patient was taken to the cath lab on 09/09/00 by Dr. Veneda Melter.  Catheterization revealed mild disease in the left veins.  The LAD was noted to have three diagonals.  In the first diagonal there is a 30% lesion. There is also noted to be a 50% lesion after the second diagonal.  In the circumflex artery there is a mild 50% lesion in the ______ area.  Right coronary artery was noted to be dominant with some mild irregularities.  Left ventriculogram was not performed.  That evening, the patient had some episodes of symptomatic bradycardia.  The patients dose of verapamil was lowered and the patient was taken off Digoxin.  The following day, the patient was again seen by Dr. Chales Abrahams.  He started the patient on amiodarone and felt the patient would be stable for discharge.  DISCHARGE MEDICATIONS: 1. ______ 180 mg q.d. 2. Amiodarone 200 mg b.i.d. 3. Patient is to resume other medications at home with the exception of the    digoxin.  DISCHARGE INSTRUCTIONS: 1. Patient was advised to avoid driving, heavy lifting for 24 hours. 2. Patient is to eat a low-fat, low-cholesterol diet. 3. Patient is to watch for pain, swelling or bleeding at the cath site and to    call the [place] office if has any of these problems. 4. Patient is to contact the Coumadin clinic today for an appointment on    Monday. 5. Patient is to be called by the [place] office to pick up a 30-day monitor. 6. Patient is to follow up with scheduled cardioversion on March 28.  LABORATORY DATA:  No lab tests were performed. DD:  09/10/00 TD:  09/11/00 Job:  [id number]

## 2010-11-21 NOTE — Consult Note (Signed)
[place]  Patient:    [name] Day, [name] Day                      MRN: [id number] Adm. Date:  [id number] Disc. Date: [id number] Attending:  Harrel Carina CC:         [name], [name] [facility]                          Consultation Report  REQUESTING PHYSICIAN:  Dr. ______  CHIEF COMPLAINT:  Cough.  HISTORY OF PRESENT ILLNESS:  [name] Day is a 77 year old male patient of Dr. Kirstie Mirza with multiple medical problems.  He was discharged from the hospital in early May with a diagnosis of right lower lobe pneumonia.  He had been doing well up until last night when he developed a productive cough that was tinged with blood.  He had two episodes where the sputum was mostly red and contained approximately one tablespoon of blood each time.  The hemoptysis has since resolved since being in the emergency department.  This is of concern as he is on warfarin.  He denies any fever or shortness of breath.  He does have chronic dyspnea on exertion, but that is unchanged.  There is no recent wheezing.  He does state he has felt under the weather over the past three to four days, but this is nothing terribly unusual for him.  He has been doing all of his ADLs and he even worked last week.  He does have a chronic cough and the only difference now is the hemoptysis as described above.  He has not had hemoptysis any time previously.  He gets his protime checked at the Coumadin Clinic at [facility]  PAST MEDICAL HISTORY:  Significant for pneumonia in May.  He has COPD, type 2 diabetes (recently diagnosed), chronic atrial fibrillation, hypertension, mild renal insufficiency, osteoarthritis, gastroesophageal reflux, colon polyps and prostate cancer followed by Dr. Wilson Singer (PSA done in the hospital today).  CURRENT MEDICATIONS:  1. Digoxin 0.125 mg p.o. q.d.  2. K-Dur 20 mEq p.o. q.d.  3. Guaifenesin 600 mg p.o. b.i.d.  4. Darvocet 100 mg p.o. q.6h. p.r.n.  5.  Amaryl 4 mg p.o. q.d.  6. Triamterene/hydrochlorothiazide 37.5/25 one p.o. q.d.  7. Coumadin 5 mg for three days followed by 7.5 mg dose on a repeating basis.  8. Verapamil SR 240 mg p.o. q.d.  9. Hydroxyzine 25 mg p.o. q.d. 10. Lipitor 10 mg p.o. q.d. 11. Zantac 150 mg p.o. b.i.d. 12. Cardura 2 mg p.o. q.d.  ALLERGIES:  Allergic to PENICILLIN which caused a rash and syncope.  REVIEW OF SYSTEMS:  Significant for chronic fatigue and hypersomnolence which seems to be worsened over the past three days, but it has been an ongoing complaint for months.  He denies any chest pain, shortness of breath, abdominal pain, dysuria, rashes or any other complaints other than those listed above.  SOCIAL HISTORY:  He is married.  He stopped smoking in May 2001.  FAMILY HISTORY:  Father deceased with prostate cancer.  Mother deceased with colon cancer.  PHYSICAL EXAMINATION:  VITAL SIGNS:  Blood pressure 116/47, pulse 60, respirations 20, temperature 98.  Pulse oximeter 95 to 98% on room air.  GENERAL:  He appears to be a well-developed, well-nourished, elderly male in no acute distress.  He is resting comfortably on a hospital bed.  At the time of my examination, his  blood pressure was 120/70, pulse 55, respirations 14.  HEENT:  Normocephalic, atraumatic.  Extraocular muscles are intact. Oropharynx is moist.  There is no evidence of bleeding.  NECK:  Supple without lymphadenopathy, thyromegaly, jugular venous distension or carotid bruits.  CHEST:  Significant for few rhonchi at the left base, otherwise, clear to auscultation with good air movement.  CARDIAC:  S1 and S2 are regular.  He is somewhat bradycardic.  I do not hear any irregularity to his heart beat.  The PMI is slightly enlarged and laterally displaced.  No significant murmurs or gallops.  ABDOMEN:  Active bowel sounds, soft, nontender, no hepatosplenomegaly, no masses are palpated.  EXTREMITIES:  No clubbing, cyanosis, or  edema.  NEUROLOGIC:  He is alert and oriented.  He sits up on his own.  He is able to stand and walk.  LABORATORY DATA:  White blood cell count 10,000, hemoglobin 12.3, platelet count 270,000.  Neutrophil count 74%.  APTT was 56, INR 2.8.  BMET was significant for a glucose of 133, BUN 38, creatinine 2.2.  (Last hospitalization, creatinine ranged from 1.6 to 2.2).  Chest x-ray demonstrates left lower lobe pneumonia with possible effusion.  ASSESSMENT/PLAN: 1. New onset hemoptysis with radiologic diagnosis of pneumonia.  I think his    clinical picture fits with pneumonia.  Suspect his hemoptysis is    exacerbated by warfarin.  His hemoptysis for the time being has stopped.    He continues to have a productive cough though.  He was treated in the    emergency room with a dose of Rocephin (1 gm).  Other laboratories which    were ordered were blood cultures x2 and a sputum culture, all obtained    prior to antibiotics.  [name] Day looks very well in the emergency    department.  He is hemodynamically stable.  His room air saturations are    normal.  He does not appear in any distress.  After a long conversation    with [name] Day and his wife, it was decided that he would be safe at    home.  They were told that his hemoptysis is of some concern.  They are to    call me for any recurrent hemoptysis.  He recently has been treated with    Levaquin.  I think we will use a different course of antibiotics.  We will    use Ceftin 250 mg p.o. b.i.d. for ten days and a Z-Pack.  He was given his    first dose of azithromycin here in the  emergency room. 2. Renal insufficiency.  This is chronic and can be followed up as an    outpatient. 3. Chronic anticoagulation.  For the time being, I would like to back off on    his Coumadin.  We will decrease his Coumadin to 2.5 mg p.o. q.d. and have    him get a protime checked in three to four days at the Coumadin Clinic. 4. Followup plan is with Dr.  Kriste Basque in one to two weeks.  If they have any    questions over the weekend, or if they are unable to contact Dr. Kriste Basque next     week, they were given my phone number.  This was all discussed with [name] Day and his wife.  They understand and agree with the plan. DD:  01/17/00 TD:  01/17/00 Job: [id number]

## 2010-11-21 NOTE — Op Note (Signed)
[name], [name] Day                       ACCOUNT NO.:  [id number]   MEDICAL RECORD NO.:  [id number]                   PATIENT TYPE:  INP   LOCATION:  2017                                 FACILITY:  [facility]   PHYSICIAN:  [name], [name] [facility]            DATE OF BIRTH:  September 27, 1923   DATE OF PROCEDURE:  DATE OF DISCHARGE:                                 OPERATIVE REPORT   INDICATIONS:  The patient is a 77 year old white man with a history of  arteriosclerotic heart disease, arteriosclerotic peripheral vascular disease  and COPD.  He presented with bright red hemoptysis and chest x-ray that  showed a rounded opacity at the left base.  CT scan showed what appeared to  be an old emphysematous bleb at the left base of his lung that was filled up  with material, either old blood or possibly a fungus ball.  His Coumadin was  discontinued and he was hospitalized with stabilization on antibiotics and  bronchodilator therapy.  Dr. Tyrone Sage was consulted for CVTS for possible  surgical intervention.  Bronchoscopy was undertaken at the present time to  evaluate the airways and for bronchial lavage.  We want to be sure there are  no other lesions in the tracheobronchial tree and see if the blood is indeed  coming from the left lower lobe or possibly from other areas as well.   PREMEDICATION AND ANESTHESIA:  Bronchoscopy was performed in the endoscopy  suite without fluoro.  An IV was started with D5W KVO.  Cardiac monitoring  was done revealing atrial fibrillation with rapid ventricular response.  Oxygen was administered by nasal cannula and continue finger pulse oximetry  was performed throughout the test.   Topical anesthesia was obtained with Xylocaine, about 300 mg given topically  during the procedure.  General anesthesia was obtained with 7.5 mg of Versed  given IV slowly during the procedure.  Cough anesthesia was obtained with 70  mg of Demerol given IV slowly during the test.  The  patient tolerated the  procedure well.   FINDINGS:  1. UPPER AIRWAY AND CORDS:  The upper airway was normal as far as could be     seen.  The larynx including the false cords and true cords appeared     normal, and the vocal cords moved normally with phonation.  2. TRACHEA AND CARINA:  The trachea was normal and carina was sharp.  3. BRONCHI:  There was essentially normal endobronchial anatomy throughout.     The right side was normal except for some mild excess endobronchial     pitting compatible with underlying chronic bronchitis.  There were no     endobronchial lesions seen anywhere on the right side.  There were some     thick secretions.  These were easily lavaged clear with saline.   Careful attention was directed to the left side.  There were some old,  bloody secretions  seen along the inferior tracheal wall that seemed to  emanate from the left lower lobe.  The left upper lobe and lingula were  inspected first.  There was normal anatomy and no lesions identified.  On  inspecting the left lower lobe, old blood and mucoid secretions were seen  plugging the basilar segments.  With careful bronchial lavage, the blood  appeared to be emanating from the medial basal segment.  This was lavaged  clear with a copious amount of saline and no active bleeding was  encountered.  There were no endobronchial lesions noted down to the  subsegmental levels.  After careful inspection above, the airways were  lavaged clear with saline.    IMPRESSION:  Abnormal bronchoscopy with old blood emanating from the left  lower lobe, most directly the medial basal segment.  NO other lesions were  identified in the tracheobronchial tree.   SPECIMENS:  Bronchial washings for cytology and cultures including routine  AFB and fungi.                                               [name], [name] [facility]    SMN/MEDQ  D:  01/05/2003  T:  01/05/2003  Job:  [id number]

## 2010-11-21 NOTE — Discharge Summary (Signed)
[facility]  Patient:    [name] Day, [name] Day                      MRN: [id number] Adm. Date:  [id number] Disc. Date: [id number] Attending:  Verdene Lennert CC:         [name], [name] [facility]                           Discharge Summary  DATE OF BIRTH:  Dec 17, 1923.  FINAL DIAGNOSES:  1. Chronic obstructive pulmonary disease exacerbation with right lower lobe     pneumonia -- no organism specified.  2. Diabetes mellitus with steroid exacerbation.  3. Arteriosclerotic heart disease with chronic atrial fibrillation, on     Coumadin.  4. Hypertension, controlled on therapy.  5. Mild renal insufficiency.  6. History of prostate cancer, followed by Dr. Excell Seltzer. [name] Day.  7. History of hypercholesterolemia.  8. Arteriosclerotic peripheral vascular disease with venous insufficiency.  9. History of colon polyps and mild diverticulosis with a positive family     history of colon cancer. 10. History of gastroesophageal reflux disease with previous Helicobacter     pylori infection -- treated. 11. Degenerative arthritis with previous left total hip replacement.  BRIEF HISTORY AND PHYSICAL:  The patient is a 77 year old gentleman, known to me with multiple medical problems, admitted on November 03, 1999 with right lower lobe pneumonia.  He presented to the office on the day of admission with a four- to five-day history of upper respiratory tract symptoms, nonproductive cough, chest congestion and temperature to 101 degrees.  He had progressive dyspnea and weakness.  He has a history of underlying COPD and continues to smoke a half a pack of cigarettes a day, despite efforts at getting him to quit.  He was evaluated in the office where exam showed bilateral rhonchi and rales at the right base and chest x-ray showed a faint right basilar infiltrate, compatible with pneumonia.  He was admitted for IV antibiotic treatment.  Previous pulmonary function studies in 1999  revealed an FVC of 2.04 L (45% of predicted); FEV-1 of 0.90 L (25% of predicted); and an FEV-1/FVC ratio of 44%, compatible with severe air flow obstruction.  Past medical history includes arteriosclerotic heart disease with atrial fibrillation, on Coumadin therapy.  He has a history of mild mitral valve prolapse and normal coronaries on catheterization in the past.  He had a 2-D echo in 1996 that showed mild mitral valve prolapse and normal left ventricular function with an ejection fraction around 60%.  He has a history of hypertension, controlled on medications.  He has a history of mild renal insufficiency with a previous evaluation by Dr. Aram Beecham B. Eliott Nine of the nephrology service.  His creatinines have been in the 1.8 to 1.9 range. [name] Day had prostate cancer in 1990 and was treated by Dr. Annabell Howells with a radical prostatectomy.  He had had a previous TURP in 1986.  There is also a positive family history of prostate cancer.  He has a history of hypercholesterolemia, controlled on Lipitor.  His last cholesterol was 165 in November of last year.  He has a history of arteriosclerotic peripheral vascular disease with venous insufficiency, had negative venous Dopplers previously and normal arterial Dopplers in March of this year.  He has moderate severe colon polyps and a positive family history of colon cancer in his mother.  His last colonoscopy was  in July of 1999 with multiple small polyps seen.  He also has mild diverticulosis and is followed by Dr. Ulyess Mort.  He has a history of gastroesophageal reflux disease with previous H. pylori infection, treated by Dr. Doreatha Martin in the past.  Last endoscopy was in 1996.  He had a small hiatus hernia, some reflux esophagitis and gastritis at that time.  He has a history of degenerative arthritis, with a previous left total hip replacement.  ALLERGIES:  He is allergic to PENICILLIN.  PHYSICAL EXAMINATION:  Physical examination at the  time of admission revealed a 77 year old gentleman in mild distress secondary to dyspnea.  Blood pressure 144/66, pulse 66 per minute and irregular, respirations per minute and no labored, temperature 98.6.  HEENT exam revealed slight pharyngeal erythema but no exudates seen.  Ears were negative.  Neck exam showed no jugular venous distention.  The carotids were 2+ and equal bilaterally without bruits.  There was no thyromegaly or lymphadenopathy.  Chest exam revealed decrease in breath sounds bilaterally with bilateral rhonchi at the bases but no signs of consolidation.  There were rales at the right base on deep inspiration. Cardiac exam revealed an irregular rhythm and grade 1/6 systolic ejection murmur at left sternal border.  No rubs or gallops heard.  The abdomen was soft and nontender without evidence of organomegaly or masses.  Rectal was deferred.  Extremities showed no clubbing, cyanosis, or edema.  There were some venous insufficiency changes noted.  He had previous total hip replacement with good range of motion.  Neurologic exam was intact without focal abnormalities detected.  LABORATORY DATA:  EKG showed atrial fibrillation with controlled ventricular response and left axis deviation.  There was poor R wave progression from V1 to V3 and nonspecific ST-T wave changes.  Two-dimensional echo was performed and revealed mild left atrial enlargement, mild concentric left ventricular hypertrophy but normal left ventricular function, mild mitral valve prolapse, mild-to-moderate mitral regurgitation.  Chest x-ray on Nov 04, 1999 revealed moderate cardiomegaly, accentuation of the hilar and basilar markings, area of atelectasis and/or infiltrate in the right middle lobe.  Followup film on Nov 08, 1999 showed improved aeration in the right base with unchanged cardiomegaly and underlying COPD.  No residual pneumonia was present.  Hemoglobin 14.3, hematocrit 42.7, white count 13,700,  with 85% segs.  Sed rate 96.  Pro time 23.6 seconds with an INR at 3.1.  PTT 75 seconds.  Serial pro times were followed throughout his hospital course.  Sodium 141, potassium  4.0, chloride 101, CO2 23, BUN 35, creatinine 2.1.  Subsequent BUN 50, creatinine 1.8.  Blood sugar, 109 on admission, jumped up to 524 with IV Solu-Medrol and patient was placed on sliding-scale insulin.  Calcium 9.0, total protein 6.9, albumin 3.3, ALT 15, AST 37, alkaline phosphatase 139, total bilirubin 1.1.  Hemoglobin A1c 7.0.  TSH 1.3.  Digoxin level 1.2.  Blood cultures:  No growth.  Sputum culture:  Normal oropharyngeal flora only.  HOSPITAL COURSE:  Patient was admitted with COPD exacerbation and infiltrate at the right base.  He was started on IV Levaquin and Solu-Medrol.  He was given hand-held nebulizer treatments with Albuterol and continued on his home medications including Lipitor, Cardura, verapamil, Lanoxin and Protonix.  He was given Tussionex for cough.  Blood sugar was found to be markedly elevated with the IV fluids and Solu-Medrol and his fluids were changed to half normal saline and he was given sliding-scale insulin and Accu-Cheks were followed q.i.d.  Blood sugars remained elevated in the afternoon as the Solu-Medrol was weaned and he was started on Amaryl.  From a respiratory standpoint, he improved nicely with decrease in cough and sputum production.  The IV Levaquin was changed to oral Levaquin and sputum cultures grew just normal throat flora.  He was also started on Serevent and Flovent and given ______ valve treatments to aid in mucus clearance. Followup chest x-ray showed resolution of the atelectasis and/or infiltrate at the right base.  Patient was given diabetic instruction on home Accu-Cheks and diet management. He was started on Amaryl, as noted, and careful outpatient followup will be continued.  MEDICATIONS AT DISCHARGE:  1. Levaquin 500 mg p.o. q.d. for five more days,  then discontinue.  2. Amaryl 4 mg p.o. q.a.m., with instructions to check his blood sugars twice     a day.  3. Lanoxin 0.125 mg p.o. q.d.  4. Verapamil 240 mg p.o. q.d.  5. Maxzide one tablet p.o. q.d.  6. K-Dur 20 mEq one tablet p.o. q.d.  7. Cardura 2 mg p.o. q.d.  8. Zantac 150 mg p.o. b.i.d.  9. Serevent two sprays b.i.d. 10. Flovent 220 mcg, two inhalations b.i.d. 11. Lipitor 10 mg p.o. q.d.  CONDITION AT DISCHARGE:  Improved.  FOLLOWUP:  Arrangements were made for followup in the office on Nov 20, 1999, at which time we will recheck his sugar. DD:  11/23/99 TD:  11/26/99 Job: [id number]

## 2010-11-21 NOTE — Discharge Summary (Signed)
[name], [name] Day             ACCOUNT NO.:  [id number]   MEDICAL RECORD NO.:  [id number]          PATIENT TYPE:  ORB   LOCATION:  4522                         FACILITY:  [facility]   PHYSICIAN:  [name], [name] [facility] OF BIRTH:  1923-11-02   DATE OF ADMISSION:  06/04/2004  DATE OF DISCHARGE:  06/09/2004                                 DISCHARGE SUMMARY   FINAL DIAGNOSES:  1.  Transferred from Medical Ward to Subacute Care Unit on June 04, 2004.  He had weakness and dependency in activities of daily living.      The patient improved with a subacute care unit level therapies over six      days and was ready for discharge on June 09, 2004.  2.  Arteriosclerotic heart disease with nonobstructive coronary disease on      catheterization.  3.  Congestive heart failure, nonischemic cardiomyopathy with ejection      fraction in the 25-30% range and Beta Natriuretic Peptide of 2900 on      previous admission.  4.  Cardiac arrhythmia with atrial fibrillation with rapid ventricular      response, left bundle branch block, and nonsustained ventricular      tachycardia.  Status post cardioverter defibrillator implantation with      postoperative left pneumothorax requiring chest tube per cardiovascular      thoracic surgery.  5.  History of chronic obstructive pulmonary disease with pseudomonas      pneumonia in the past __________ -- controlled on medications.  6.  History of renal insufficiency with creatinine in the 1.7-1.8 range.  7.  History of arteriosclerotic peripheral vascular disease with known left      internal carotid stenosis.  8.  History of hyperlipidemia.  9.  History of diabetes.  10. History of gastroesophageal reflux disease and esophageal dysmotility      with positive Helicobacter pylori during recent outpatient evaluation.      He was intolerant to amoxicillin and Flagyl therapy previously.  11. Diverticulosis.  12. Degenerative arthritis with previous  left total knee replacement.  13. History of prostate cancer with surgery in 1990.  14. Marked debilitation and dependency in activities of daily living as      noted.   BRIEF HISTORY AND PHYSICAL:  The patient was transferred to [facility] on June 04, 2004 because of weakness and dependency in activities of daily living.  He had just been discharged from a two-week hospitalization -- see June 04, 2004 discharge summary previously dictated.   PHYSICAL EXAMINATION AT TIME OF TRANSFER:  Revealed a chronically ill-  appearing 77 year old gentleman in no acute distress.  Blood pressure  120/60, pulse 76 and regular, respirations 20 per minute and not labored,  temperature 98 degrees.  HEENT:  Exam unremarkable.  NECK:  Exam showed no jugular venous distention, no carotid bruits, no  thyromegaly or lymphadenopathy.  CHEST:  Exam revealed decreased breath sounds, a few scattered rhonchi at  the bases, no signs of consolidation.  CARDIAC:  Exam revealed irregular rhythm with a 1/6 systolic ejection murmur  left sternal border without rubs or gallops heard.  ABDOMEN:  Soft, nontender, without evidence of organomegaly or masses.  EXTREMITIES:  No cyanosis, clubbing, there was trace edema.  NEUROLOGIC:  Exam was intact without focal abnormalities detected.   HOSPITAL COURSE:  As noted, the patient was transferred to the [facility] for  ongoing rehab.  While on SACU his medications were adjusted and he improved  with physical therapy, occupational therapy, and the management team.  Chest  x-ray showed minimal persistent left apical pneumothorax, small effusion and  basilar atelectasis.  CBC showed a hemoglobin of 11.3, hematocrit 32.9,  white count 16,200 with 75% segs.  Pro time 23.1 seconds, INR 2.7.  Sodium  137, potassium 3.7, chloride 105, CO2 22, BUN 21, creatinine 1.7, blood  sugar 105, calcium 8.6, total protein 6.2, albumin 2.8, AST 42, ALT 14,  alkaline phosphatase 77, total bilirubin  0.9, hemoglobin A1c 6.3, Beta  Natriuretic Peptide 49.   The patient was felt to be [facility] ready for discharge on June 09, 2004.   MEDICATIONS AT DISCHARGE:  1.  Lanoxin 0.125 mg 1/2 tablet p.o. daily.  2.  Lasix 40 mg 1 p.o. q. a.m.  3.  Coreg 6.25 mg 1 tablet p.o. b.i.d.  4.  Zocor 40 mg p.o. q.h.s.  5.  Niacin 1000 mg p.o. q.h.s.  6.  Enteric-coated aspirin 81 mg p.o. daily.  7.  Coumadin 7.5 mg tablets, 1/2 tablet each day in the afternoon.  8.  Protonix 40 mg p.o. daily.  9.  Calcitriol 0.5 mg p.o. daily.  10. K-Dur 20 1 tablet p.o. daily.  11. Hand-held nebulizer with DuoNeb 3-4 times a day.  12. Mucinex 600 mg 1 tablet p.o. b.i.d. with plenty of fluids.  13. Senokot-S 1-2 tablets p.o. q.h.s.  14. Tylenol as needed for pain.  15. Glipizide 5 mg tablets, 1/2 tablet p.o. q. a.m. for blood sugar.   The patient will follow up with Dr. Kriste Basque in the office on Thursday,  June 12, 2004 at 9:00 a.m.  Followup chest x-ray and labs will be  obtained.   CONDITION ON DISCHARGE:  Improved.      SMN/MEDQ  D:  08/01/2004  T:  08/02/2004  Job:  [id number]

## 2010-11-21 NOTE — Procedures (Signed)
[place]  Patient:    [name] Day, [name] Day                    MRN: [id number] Proc. Date: 12/29/00 Adm. Date:  [id number] Attending:  Veneda Melter CC:         [name], [name] [facility]  Aram Beecham B. Eliott Nine, [name]   Procedure Report  PROCEDURES PERFORMED: 1. Abdominal aortogram. 2. Bilateral selective renal angiography. 3. Perclose right femoral artery.  DIAGNOSES: 1. Chronic renal insufficiency. 2. Moderate renal atherosclerotic disease.  HISTORY:  [name] Day is a 77 year old gentleman with a history of atrial fibrillation and mild coronary artery disease, who has had progressive renal dysfunction.  He has evidence of a shrinking renal mass by noninvasive studies and a recent MRI of the renal arteries performed on Nov 23, 2000 suggested high-grade narrowing at the ostium of the superior right renal artery, as well as moderate narrowing in the mid section of the left renal artery.  Due to progressive renal dysfunction and suggestion of renal artery stenosis on MRI/MRA, he presents now for angiographic study.  TECHNIQUE:  Informed consent was obtained.  The patient had previously been admitted to the hospital for hydration and initiation of fenoldopam drip.  He was brought to the catheterization laboratory and a 5-French sheath placed in the right femoral artery.  A 5-French pigtail catheter was advanced into the abdominal aorta and an abdominal aortogram performed using manual injections of CO2.  This was then upsized for a 6-French sheath and a 6-French pigtail catheter.  Repeat angiography was performed using digital substraction technique.  A 6-French JR-4 diagnostic catheter was then introduced and used to engage the renal arteries.  Selective angiography was then performed using CO2 and manual injections.  Approximately 35 cc of gadolinium was also used during the case to further visualize the right superior renal artery due to some  overlap with the branches of the celiac trunk.  At the termination of the case, a Perclose suture closure device was deployed to the right femoral artery until adequate hemostasis was achieved.  The patient tolerated the procedure well and was transferred to the floor in stable condition.  FINDINGS: #1 - Abdominal aorta was of normal caliber.  There was moderate diffuse atheromatous disease.  #2 - Left renal artery was single and patent.  This was a medium caliber vessel.  There was evidence of a focal narrowing in the proximal segment, which appeared to compromise the lumen by approximately 50%.  There was no transstenotic gradient.  #3 - Right renal artery.  There appeared to be two small right renal arteries. The superior division appeared to be the major vessel supplying the kidney. This appeared ratty in nature.  There was a borderline lesion of 40-50% in the mid section of the vessel.  There was no ostial gradient.  The right kidney by visualization appeared shrunken and diseased and renal artery flow appeared sluggish.  The inferior right renal artery has mild diffuse disease as well without critical stenosis.  ASSESSMENT/PLAN:  [name] Day is a 77 year old gentleman with moderate renal atherosclerotic disease and progressive renal dysfunction.  No critical lesion was identified and thus percutaneous intervention was unlikely to be of benefit to the patient.  Continued medical therapy will be pursued. DD:  12/29/00 TD:  12/29/00 Job: [id number]

## 2010-11-21 NOTE — Assessment & Plan Note (Signed)
[facility]                          CHRONIC HEART FAILURE NOTE   NAME:[name] Day, [name] Day                    MRN:          [id number]  DATE:09/14/2006                            DOB:          1924-03-15    Primary care physician is [name], MD.  Primary cardiologist is Rollene Rotunda, MD.  Nephrology:  [facility] Salvia. Eliott Nine, MD.   [name] Day returns today for follow-up of his congestive heart  failure, which is end-stage, being maintained on a dobutamine drip under  the care of hospice.  [name] Day has been doing well since I saw him  last on February 26, continuing dobutamine drip at 2.5 mcg.  He was in  mild volume overload when I saw him back in April.  I had him continuing  his furosemide and adding Zaroxolyn 2.5 mg for three days.  The patient  states he responded well to the Zaroxolyn.  He also underwent cataract  surgery to his right eye last week, has done well with this.  He is  planning on having the left eye done eventually.  The patient is  concerned about his wife, who is pending hip surgery but has evidently  been sick with a cough at home.  Otherwise, [name] Day states he has  been doing quite well.   PAST MEDICAL HISTORY:  1. Congestive heart failure secondary to nonischemic cardiomyopathy      with an EF of 30%, status post implant of a biventricular      cardioverter-defibrillator.  Patient being maintained on dobutamine      drip in the setting of end-stage heart failure under the care of      hospice.  2. Atrial fibrillation, rate-controlled.  3. Chronic renal insufficiency with creatinine between 2-3.  4. DO NOT RESUSCITATE status.  5. Diabetes.   REVIEW OF SYSTEMS:  As stated above.   CURRENT MEDICATIONS:  1. Digoxin 2.5 mg Monday, Wednesday, Friday.  2. Furosemide 80 mg in the morning and 40 mg in the evening.  3. Hydralazine 50 mg three times a day.  4. Lipitor 20 m.  5. Methylprednisolone 4 mg daily.  6.  Isosorbide 120 mg daily.  7. Coumadin as directed.  8. Calcitriol.  9. Senokot.  10.Guaifenesin.  11.Prilosec.  12.DuoNeb.  13.Amaryl 4 mg daily.  14.Januvia 100 mg at bedtime.  15.Klor-Con 20 mEq daily.  16.Dobutamine 2.5 mcg.   PHYSICAL EXAMINATION:  VITAL SIGNS:  Weight 177 pounds.  Weight has been  stable.  Blood pressure 115/59 with a pulse of 72.  The patient is  saturating 95% on room air.  VITAL SIGNS:  He has no jugular venous distention at a 45-degree angle.  LUNGS:  Clear to auscultation.  CARDIOVASCULAR:  S1 and S2, positive S3.  ABDOMEN:  Soft and nontender.  He has a pulsatile liver.  LOWER EXTREMITIES:  The patient has a trace of edema on the right with  1+ nonpitting edema on the left.   IMPRESSION:  End-stage heart failure, being maintained on a dobutamine  drip with a DO NOT RESUSCITATE status.  Will continue current  medications and see the patient back in a month, sooner if he has any  problems.      Dorian Pod, ACNP  Electronically Signed      Rollene Rotunda, MD, [facility]  Electronically Signed   MB/MedQ  DD: 09/14/2006  DT: 09/16/2006  Job #: [id number]   cc:   [name], MD

## 2010-11-21 NOTE — H&P (Signed)
[name], [name] Day                       ACCOUNT NO.:  [id number]   MEDICAL RECORD NO.:  [id number]                   PATIENT TYPE:  INP   LOCATION:  0343                                 FACILITY:  [facility]   PHYSICIAN:  [name], [name] [facility]            DATE OF BIRTH:  12-26-1923   DATE OF ADMISSION:  08/24/2002  DATE OF DISCHARGE:                                HISTORY & PHYSICAL   CHIEF COMPLAINT:  Weak and dizzy.   HISTORY OF PRESENT ILLNESS:  The patient is a 77 year old white male very  complicated patient of [name], [name] [facility] that has a known history of  coronary artery disease, arteriosclerotic peripheral vascular disease, COPD,  and atrial fibrillation.  Presents related to acute onset of severe  dizziness, weakness, diaphoresis, and nausea.  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  On examination blood  pressure was decreased 80/50.  The patient was extremely pale and clammy.  The patient has recently been started on a new blood pressure medicine which  I believe is lisinopril.  He took his first dose this morning.  This was  started by his nephrologist.  The patient denies any associated chest pain,  palpitations, shortness of breath, leg swelling, bloody stools,  hematochezia, headache, visual changes, or confusion.  The patient states he  had no recent illness or travel.  Throughout the examination patient was  extremely pale, clammy, and weak, unable to stand without becoming nauseous.  The patient will be require hospitalization for severe hypotension and  weakness, for IV fluids and further evaluation.   PAST MEDICAL HISTORY:  1. Coronary artery disease with preserved left ventricular function.  Last     catheterization showed a 30-50% lesion in the LAD with moderate pulmonary     hypertension.  2. COPD.  Current smoker.  3.  Arteriosclerotic peripheral vascular disease with moderate left carotid     stenosis 60-79% on carotid Dopplers in September 2002.  The patient has     had a previous evaluation by Quita Skye. Hart Rochester, [name] in June of 2003.     Also, moderate bilateral renal artery stenosis.  4. Atrial fibrillation on chronic Coumadin therapy.  5. History of bradycardia secondary to amiodarone and verapamil.  6. Diabetes mellitus.  Last hemoglobin A1C was 6.1 on August 08, 2002.  7. Hyperlipidemia.  Total cholesterol of 172 and an LDL of 93 on August 08, 2002.  8. Hypertension.  9. Chronic renal insufficiency followed by [facility]. Eliott Nine, [name] in     nephrology.  His last creatinine was 1.9 on August 08, 2002.  10.      CHF.  11.      Gastroesophageal reflux disease.  12.  Prostate cancer status post TURP December of 1990 followed by [facility]. Annabell Howells, [name]  13.      Colon polyps and diverticulosis with a family history of colon     cancer.  14.      Osteoarthritis with previous total left hip replacement.   CURRENT MEDICATIONS:  1. Coumadin daily.  2. Combivent two sprays q.i.d.  3. Flovent 220 mcg two puffs b.i.d.  4. Calcitrol 0.25 mg two p.o. daily.  5. Lasix 60 mg daily.  6. Albuterol nebulizer p.r.n.  7. Niacin 1000 mg q.h.s.  8. Aciphex 20 mg daily.  9. Aspirin 81 mg daily.  10.      Allegra 180 mg daily.  11.      Potassium 10 mEq daily.  12.      Guaifenesin 600 mg b.i.d.  13.      Zocor 40 mg daily.  14.      Allopurinol 300 mg daily.  15.      Glipizide 10 mg b.i.d.  16.      Actos 45 mg daily.  17.      Lisinopril 5 mg daily.   ALLERGIES:  PENICILLIN.   SOCIAL HISTORY:  The patient is married.  Works part-time as the Development worker, international aid at  [employer].  Previously retired from [employer].  Has three children.  Is a current smoker.  Denies any alcohol use.   FAMILY HISTORY:  Positive for coronary artery disease, diabetes mellitus,  hypertension, colon cancer, and prostate  cancer.   REVIEW OF SYSTEMS:  Essentially negative except as noted above.   PHYSICAL EXAMINATION:  GENERAL:  The patient is pale, clammy, and weak on  examination with generalized weakness.  VITAL SIGNS:  Temperature 98.4, blood pressure 80/50, pulse irregular 83,  respiratory rate 16, O2 saturation 95% on room air.  HEENT:  PERRLA.  Facial features are symmetrical.  TMs are normal.  Nasal  mucosa is pink and moist.  Posterior pharynx is clear.  NECK:  Supple without cervical adenopathy.  No JVD.  Carotids are with  positive upstrokes bilaterally.  There is a positive bruit on the left.  LUNGS:  Sounds somewhat diminished in the bases, otherwise clear without any  wheezing or crackles.  CARDIAC:  Irregular irregular rhythm.  ABDOMEN:  Soft without any hepatosplenomegaly.  No guarding or rebound  noted.  No abdominal bruits or masses appreciated.  EXTREMITIES:  Warm without any calf tenderness, clubbing, cyanosis, edema.  Moves all extremities well with equal strength of the upper and lower  extremities.  NEUROLOGIC:  Alert and oriented x3.  The patient is very unsteady and off  balance requiring two person assistance.  SKIN:  Clammy and pale.   IMPRESSION AND PLAN:  1. Severe orthostatic hypotension.  The patient will require admit     hospitalization for intravenous fluids.  Lisinopril has been discontinued     at this time.  Laboratories are currently pending.  Will follow up     accordingly.  2. History of coronary artery disease, atrial fibrillation, arterial     peripheral vascular disease.  The patient has an extremely complicated     history.  Will need to rule out possible underlying myocardial     infarction.  Cardiac enzymes are pending.  The patient has been placed on     oxygen therapy.  Will continue Coumadin per pharmacy protocol and     telemetry bed.  3.    Diabetes  mellitus.  The patient is to continue on Actos and Glipizide.     Fasting Accu-Cheks daily.  4.  Chronic renal insufficiency and renal artery stenosis.  Renal has been     contacted for a hospital consult.  Lisinopril has been added to his     regimen and will need further evaluation.     Tammy Parrett, P.A. [facility]                   Scott M. Kriste Basque, [name] [facility]    TP/MEDQ  D:  08/24/2002  T:  08/24/2002  Job:  [id number]   cc:   [name], [name] [facility]   Aram Beecham B. Eliott Nine, [name]  [address]  [place]  [place] [address]  Fax: [phone]   Veneda Melter, [name] [facility]

## 2011-03-27 LAB — BASIC METABOLIC PANEL
BUN: 48 — ABNORMAL HIGH
CO2: 22
CO2: 22
Calcium: 7.9 — ABNORMAL LOW
Calcium: 8.5
Chloride: 105
Chloride: 105
Creatinine, Ser: 2.78 — ABNORMAL HIGH
Creatinine, Ser: 3.3 — ABNORMAL HIGH
Glucose, Bld: 71
Sodium: 138

## 2011-03-27 LAB — COMPREHENSIVE METABOLIC PANEL
ALT: <8
ALT: <8
AST: 16
Albumin: 2.4 — ABNORMAL LOW
Albumin: 2.4 — ABNORMAL LOW
Alkaline Phosphatase: 72
Alkaline Phosphatase: 78
BUN: 58 — ABNORMAL HIGH
BUN: 79 — ABNORMAL HIGH
CO2: 28
Calcium: 8 — ABNORMAL LOW
Calcium: 8 — ABNORMAL LOW
Calcium: 8.2 — ABNORMAL LOW
Creatinine, Ser: 4.14 — ABNORMAL HIGH
GFR calc Af Amer: 28 — ABNORMAL LOW
GFR calc non Af Amer: 28 — ABNORMAL LOW
Glucose, Bld: 215 — ABNORMAL HIGH
Glucose, Bld: 92
Potassium: 4.7
Potassium: 4.9
Sodium: 137
Sodium: 137
Total Protein: 6.1
Total Protein: 6.2
Total Protein: 6.4

## 2011-03-27 LAB — CBC
HCT: 25.4 — ABNORMAL LOW
HCT: 29.3 — ABNORMAL LOW
HCT: 30.1 — ABNORMAL LOW
Hemoglobin: 8.3 — ABNORMAL LOW
Hemoglobin: 9.7 — ABNORMAL LOW
MCHC: 32.2
MCHC: 32.4
MCHC: 32.6
MCV: 75.9 — ABNORMAL LOW
MCV: 77.3 — ABNORMAL LOW
MCV: 77.7 — ABNORMAL LOW
Platelets: 315
Platelets: 339
RBC: 3.34 — ABNORMAL LOW
RBC: 3.91 — ABNORMAL LOW
RDW: 18.1 — ABNORMAL HIGH
RDW: 18.1 — ABNORMAL HIGH
RDW: 18.4 — ABNORMAL HIGH
WBC: 22.9 — ABNORMAL HIGH

## 2011-03-27 LAB — DIFFERENTIAL
Basophils Absolute: 0
Basophils Absolute: 0
Basophils Relative: 0
Basophils Relative: 0
Basophils Relative: 0
Eosinophils Absolute: 0
Eosinophils Absolute: 0.1
Eosinophils Absolute: 0.2
Eosinophils Relative: 0
Eosinophils Relative: 1
Lymphs Abs: 1.1
Monocytes Absolute: 0.3
Monocytes Absolute: 0.5
Monocytes Relative: 5
Neutrophils Relative %: 90 — ABNORMAL HIGH

## 2011-03-27 LAB — PROTIME-INR
INR: 1.3
INR: 1.4
INR: 1.8 — ABNORMAL HIGH
INR: 1.8 — ABNORMAL HIGH
INR: 2.3 — ABNORMAL HIGH
Prothrombin Time: 21.5 — ABNORMAL HIGH
Prothrombin Time: 21.8 — ABNORMAL HIGH
Prothrombin Time: 27.6 — ABNORMAL HIGH
Prothrombin Time: 28.2 — ABNORMAL HIGH
Prothrombin Time: 35 — ABNORMAL HIGH
Prothrombin Time: 43.9 — ABNORMAL HIGH

## 2011-03-27 LAB — CULTURE, BLOOD (ROUTINE X 2)
Culture: NO GROWTH
Culture: NO GROWTH

## 2011-03-27 LAB — EXPECTORATED SPUTUM ASSESSMENT W GRAM STAIN, RFLX TO RESP C

## 2011-03-27 LAB — URINE MICROSCOPIC-ADD ON

## 2011-03-27 LAB — URINALYSIS, ROUTINE W REFLEX MICROSCOPIC
Hgb urine dipstick: NEGATIVE
Specific Gravity, Urine: 1.018
Urobilinogen, UA: 0.2

## 2011-03-27 LAB — POCT CARDIAC MARKERS
Myoglobin, poc: >500
Operator id: 284251

## 2011-03-27 LAB — CULTURE, RESPIRATORY W GRAM STAIN

## 2011-03-27 LAB — SEDIMENTATION RATE: Sed Rate: 102 — ABNORMAL HIGH

## 2011-03-27 LAB — B-NATRIURETIC PEPTIDE (CONVERTED LAB): Pro B Natriuretic peptide (BNP): 698 — ABNORMAL HIGH

## 2011-04-14 LAB — BASIC METABOLIC PANEL
BUN: 45 — ABNORMAL HIGH
CO2: 22
Calcium: 8.6
Calcium: 8.8
Creatinine, Ser: 2.27 — ABNORMAL HIGH
Creatinine, Ser: 2.3 — ABNORMAL HIGH
GFR calc Af Amer: 33 — ABNORMAL LOW
GFR calc Af Amer: 34 — ABNORMAL LOW
GFR calc non Af Amer: 29 — ABNORMAL LOW
Glucose, Bld: 191 — ABNORMAL HIGH
Potassium: 4.3

## 2011-04-14 LAB — DIFFERENTIAL
Basophils Absolute: 0
Basophils Relative: 0
Lymphocytes Relative: 2 — ABNORMAL LOW
Monocytes Absolute: 0.1
Neutro Abs: 18.1 — ABNORMAL HIGH
Neutrophils Relative %: 97 — ABNORMAL HIGH

## 2011-04-14 LAB — URINALYSIS, ROUTINE W REFLEX MICROSCOPIC
Glucose, UA: NEGATIVE
Hgb urine dipstick: NEGATIVE
Ketones, ur: NEGATIVE
pH: 5

## 2011-04-14 LAB — CBC
HCT: 27.1 — ABNORMAL LOW
HCT: 31 — ABNORMAL LOW
Hemoglobin: 10.1 — ABNORMAL LOW
Hemoglobin: 9.5 — ABNORMAL LOW
MCHC: 33.1
MCV: 81.8
Platelets: 278
Platelets: 317
RBC: 3.57 — ABNORMAL LOW
RDW: 16.6 — ABNORMAL HIGH
RDW: 16.7 — ABNORMAL HIGH
WBC: 18.7 — ABNORMAL HIGH
WBC: 9.6

## 2011-04-14 LAB — CATH TIP CULTURE

## 2011-04-14 LAB — COMPREHENSIVE METABOLIC PANEL
Alkaline Phosphatase: 79
BUN: 25 — ABNORMAL HIGH
Chloride: 109
Creatinine, Ser: 2.18 — ABNORMAL HIGH
GFR calc non Af Amer: 29 — ABNORMAL LOW
Glucose, Bld: 205 — ABNORMAL HIGH
Potassium: 4.3
Total Bilirubin: 0.9

## 2011-04-14 LAB — CULTURE, BLOOD (ROUTINE X 2): Culture: NO GROWTH

## 2011-04-14 LAB — PROTIME-INR
INR: 1.7 — ABNORMAL HIGH
INR: 1.9 — ABNORMAL HIGH
INR: 2 — ABNORMAL HIGH
Prothrombin Time: 20.6 — ABNORMAL HIGH
Prothrombin Time: 21 — ABNORMAL HIGH
Prothrombin Time: 22.1 — ABNORMAL HIGH
Prothrombin Time: 22.3 — ABNORMAL HIGH

## 2011-04-14 LAB — DIGOXIN LEVEL: Digoxin Level: 0.4 — ABNORMAL LOW

## 2011-04-14 LAB — OCCULT BLOOD X 1 CARD TO LAB, STOOL: Fecal Occult Bld: NEGATIVE

## 2011-04-14 LAB — D-DIMER, QUANTITATIVE: D-Dimer, Quant: 1.89 — ABNORMAL HIGH

## 2011-04-14 LAB — SEDIMENTATION RATE: Sed Rate: 30 — ABNORMAL HIGH

## 2011-04-17 LAB — URINALYSIS, ROUTINE W REFLEX MICROSCOPIC
Bilirubin Urine: NEGATIVE
Hgb urine dipstick: NEGATIVE
Ketones, ur: NEGATIVE
Protein, ur: NEGATIVE
Urobilinogen, UA: 0.2

## 2011-04-17 LAB — CBC
HCT: 28.1 — ABNORMAL LOW
MCHC: 33.6
MCV: 86.7
Platelets: 203
RDW: 16.6 — ABNORMAL HIGH

## 2011-04-17 LAB — BASIC METABOLIC PANEL
BUN: 44 — ABNORMAL HIGH
CO2: 26
Chloride: 104
GFR calc non Af Amer: 30 — ABNORMAL LOW
Glucose, Bld: 124 — ABNORMAL HIGH
Potassium: 4.5

## 2011-04-20 LAB — URINALYSIS, ROUTINE W REFLEX MICROSCOPIC
Bilirubin Urine: NEGATIVE
Glucose, UA: NEGATIVE
Hgb urine dipstick: NEGATIVE
Ketones, ur: NEGATIVE
Nitrite: NEGATIVE
Protein, ur: NEGATIVE
Specific Gravity, Urine: 1.012
Urobilinogen, UA: 0.2
pH: 7

## 2011-04-20 LAB — BASIC METABOLIC PANEL
BUN: 38 — ABNORMAL HIGH
BUN: 38 — ABNORMAL HIGH
CO2: 25
CO2: 26
CO2: 27
Calcium: 9.1
Chloride: 106
Chloride: 107
Chloride: 107
Creatinine, Ser: 2.39 — ABNORMAL HIGH
Creatinine, Ser: 2.57 — ABNORMAL HIGH
GFR calc Af Amer: 31 — ABNORMAL LOW
GFR calc Af Amer: 32 — ABNORMAL LOW
GFR calc non Af Amer: 24 — ABNORMAL LOW
GFR calc non Af Amer: 28 — ABNORMAL LOW
Glucose, Bld: 102 — ABNORMAL HIGH
Glucose, Bld: 63 — ABNORMAL LOW
Glucose, Bld: 82
Potassium: 3.4 — ABNORMAL LOW
Potassium: 3.5
Potassium: 3.6
Sodium: 138
Sodium: 141

## 2011-04-20 LAB — DIFFERENTIAL
Basophils Absolute: 0
Basophils Relative: 0
Eosinophils Absolute: 0.4
Eosinophils Relative: 5
Lymphocytes Relative: 9 — ABNORMAL LOW
Lymphs Abs: 0.8
Monocytes Absolute: 0.5
Monocytes Relative: 6
Neutro Abs: 7
Neutrophils Relative %: 80 — ABNORMAL HIGH

## 2011-04-20 LAB — CBC
HCT: 30.9 — ABNORMAL LOW
HCT: 31.2 — ABNORMAL LOW
HCT: 34.2 — ABNORMAL LOW
Hemoglobin: 10.5 — ABNORMAL LOW
Hemoglobin: 10.5 — ABNORMAL LOW
Hemoglobin: 11.6 — ABNORMAL LOW
MCHC: 33.8
MCHC: 33.8
MCV: 87.7
MCV: 87.8
MCV: 88
Platelets: 238
Platelets: 261
RBC: 3.54 — ABNORMAL LOW
RBC: 3.9 — ABNORMAL LOW
RDW: 16.3 — ABNORMAL HIGH
RDW: 16.4 — ABNORMAL HIGH
RDW: 16.5 — ABNORMAL HIGH
WBC: 8.7

## 2011-04-20 LAB — CATH TIP CULTURE

## 2011-04-20 LAB — MAGNESIUM: Magnesium: 2.4

## 2011-04-20 LAB — COMPREHENSIVE METABOLIC PANEL
ALT: <8
AST: 19
CO2: 25
Chloride: 105
GFR calc Af Amer: 31 — ABNORMAL LOW
GFR calc non Af Amer: 26 — ABNORMAL LOW
Potassium: 4
Sodium: 138
Total Bilirubin: 0.9

## 2011-04-20 LAB — CARDIAC PANEL(CRET KIN+CKTOT+MB+TROPI)
Relative Index: INVALID
Total CK: 54

## 2011-04-20 LAB — PROTIME-INR
INR: 1.1
INR: 1.2
INR: 1.2
INR: 1.4
INR: 1.6 — ABNORMAL HIGH
INR: 1.7 — ABNORMAL HIGH
Prothrombin Time: 14.7
Prothrombin Time: 15
Prothrombin Time: 15.2
Prothrombin Time: 15.6 — ABNORMAL HIGH
Prothrombin Time: 17.2 — ABNORMAL HIGH
Prothrombin Time: 19.5 — ABNORMAL HIGH
Prothrombin Time: 20.9 — ABNORMAL HIGH

## 2011-04-20 LAB — APTT
aPTT: 41 — ABNORMAL HIGH
aPTT: 43 — ABNORMAL HIGH

## 2011-04-20 LAB — CULTURE, BLOOD (ROUTINE X 2)

## 2011-04-20 LAB — B-NATRIURETIC PEPTIDE (CONVERTED LAB): Pro B Natriuretic peptide (BNP): 389 — ABNORMAL HIGH

## 2011-04-20 LAB — HEMOGLOBIN A1C: Hgb A1c MFr Bld: 7.2 — ABNORMAL HIGH

## 2020-03-13 ENCOUNTER — Telehealth: Payer: Self-pay | Admitting: Emergency Medicine

## 2020-03-13 NOTE — Telephone Encounter (Cosign Needed)
A user error has taken place: encounter opened in error, closed for administrative reasons.
